# Patient Record
Sex: Male | Born: 1945 | Race: White | Hispanic: No | Smoking: Former smoker
Health system: Southern US, Community
[De-identification: ages and names within clinical notes are randomized; demographics above are authoritative.]

## PROBLEM LIST (undated history)

## (undated) DIAGNOSIS — J449 Chronic obstructive pulmonary disease, unspecified: Secondary | ICD-10-CM

## (undated) DIAGNOSIS — H9190 Unspecified hearing loss, unspecified ear: Secondary | ICD-10-CM

## (undated) DIAGNOSIS — K219 Gastro-esophageal reflux disease without esophagitis: Secondary | ICD-10-CM

## (undated) DIAGNOSIS — R0600 Dyspnea, unspecified: Secondary | ICD-10-CM

## (undated) DIAGNOSIS — I2510 Atherosclerotic heart disease of native coronary artery without angina pectoris: Secondary | ICD-10-CM

## (undated) DIAGNOSIS — E785 Hyperlipidemia, unspecified: Secondary | ICD-10-CM

## (undated) DIAGNOSIS — M1990 Unspecified osteoarthritis, unspecified site: Secondary | ICD-10-CM

## (undated) DIAGNOSIS — I10 Essential (primary) hypertension: Secondary | ICD-10-CM

## (undated) DIAGNOSIS — I499 Cardiac arrhythmia, unspecified: Secondary | ICD-10-CM

## (undated) DIAGNOSIS — Z972 Presence of dental prosthetic device (complete) (partial): Secondary | ICD-10-CM

## (undated) DIAGNOSIS — M109 Gout, unspecified: Secondary | ICD-10-CM

## (undated) DIAGNOSIS — E7800 Pure hypercholesterolemia, unspecified: Secondary | ICD-10-CM

## (undated) HISTORY — PX: COLONOSCOPY: SHX174

## (undated) HISTORY — PX: JOINT REPLACEMENT: SHX530

## (undated) HISTORY — PX: OTHER SURGICAL HISTORY: SHX169

## (undated) HISTORY — PX: TOTAL HIP ARTHROPLASTY: SHX124

---

## 2004-06-19 ENCOUNTER — Ambulatory Visit: Payer: Self-pay | Admitting: Gastroenterology

## 2010-03-22 ENCOUNTER — Emergency Department: Payer: Self-pay | Admitting: Emergency Medicine

## 2010-07-31 ENCOUNTER — Ambulatory Visit: Payer: Self-pay | Admitting: Gastroenterology

## 2010-10-16 ENCOUNTER — Ambulatory Visit: Payer: Self-pay | Admitting: Family Medicine

## 2014-04-01 DIAGNOSIS — A4902 Methicillin resistant Staphylococcus aureus infection, unspecified site: Secondary | ICD-10-CM

## 2014-04-01 HISTORY — DX: Methicillin resistant Staphylococcus aureus infection, unspecified site: A49.02

## 2017-05-28 DIAGNOSIS — G959 Disease of spinal cord, unspecified: Secondary | ICD-10-CM | POA: Insufficient documentation

## 2019-12-06 ENCOUNTER — Emergency Department: Payer: Medicare Other

## 2019-12-06 ENCOUNTER — Other Ambulatory Visit: Payer: Self-pay

## 2019-12-06 ENCOUNTER — Encounter: Payer: Self-pay | Admitting: Emergency Medicine

## 2019-12-06 DIAGNOSIS — Z87891 Personal history of nicotine dependence: Secondary | ICD-10-CM | POA: Insufficient documentation

## 2019-12-06 DIAGNOSIS — R0789 Other chest pain: Secondary | ICD-10-CM | POA: Insufficient documentation

## 2019-12-06 DIAGNOSIS — I10 Essential (primary) hypertension: Secondary | ICD-10-CM | POA: Insufficient documentation

## 2019-12-06 LAB — CBC
HCT: 41.3 % (ref 39.0–52.0)
Hemoglobin: 14.6 g/dL (ref 13.0–17.0)
MCH: 31 pg (ref 26.0–34.0)
MCHC: 35.4 g/dL (ref 30.0–36.0)
MCV: 87.7 fL (ref 80.0–100.0)
Platelets: 205 10*3/uL (ref 150–400)
RBC: 4.71 MIL/uL (ref 4.22–5.81)
RDW: 13.3 % (ref 11.5–15.5)
WBC: 13.8 10*3/uL — ABNORMAL HIGH (ref 4.0–10.5)
nRBC: 0 % (ref 0.0–0.2)

## 2019-12-06 LAB — BASIC METABOLIC PANEL
Anion gap: 14 (ref 5–15)
BUN: 22 mg/dL (ref 8–23)
CO2: 23 mmol/L (ref 22–32)
Calcium: 8.5 mg/dL — ABNORMAL LOW (ref 8.9–10.3)
Chloride: 99 mmol/L (ref 98–111)
Creatinine, Ser: 1.17 mg/dL (ref 0.61–1.24)
GFR calc Af Amer: >60 mL/min (ref >60)
GFR calc non Af Amer: >60 mL/min (ref >60)
Glucose, Bld: 189 mg/dL — ABNORMAL HIGH (ref 70–99)
Potassium: 3.4 mmol/L — ABNORMAL LOW (ref 3.5–5.1)
Sodium: 136 mmol/L (ref 135–145)

## 2019-12-06 LAB — TROPONIN I (HIGH SENSITIVITY)
Troponin I (High Sensitivity): 11 ng/L (ref <18)
Troponin I (High Sensitivity): 8 ng/L (ref <18)

## 2019-12-06 NOTE — ED Triage Notes (Signed)
Pt started acutely with chest pressure/heaviness to left chest 20-30 minutes ago.  Was having difficulty getting deep breath in. Yesterday reports having problems with reflux that woke him up at 3 AM today.  Pressure in chest today was different than the burning.  "it felt like someone was stepping on my chest".

## 2019-12-07 ENCOUNTER — Emergency Department
Admission: EM | Admit: 2019-12-07 | Discharge: 2019-12-07 | Disposition: A | Discharge status: Home / Self Care | Payer: Medicare Other | Attending: Emergency Medicine | Admitting: Emergency Medicine

## 2019-12-07 DIAGNOSIS — R079 Chest pain, unspecified: Secondary | ICD-10-CM

## 2019-12-07 HISTORY — DX: Hyperlipidemia, unspecified: E78.5

## 2019-12-07 HISTORY — DX: Essential (primary) hypertension: I10

## 2019-12-07 LAB — TROPONIN I (HIGH SENSITIVITY): Troponin I (High Sensitivity): 8 ng/L (ref <18)

## 2019-12-07 LAB — HEPATIC FUNCTION PANEL
ALT: 21 U/L (ref 0–44)
AST: 22 U/L (ref 15–41)
Albumin: 3.8 g/dL (ref 3.5–5.0)
Alkaline Phosphatase: 58 U/L (ref 38–126)
Bilirubin, Direct: 0.2 mg/dL (ref 0.0–0.2)
Indirect Bilirubin: 1 mg/dL — ABNORMAL HIGH (ref 0.3–0.9)
Total Bilirubin: 1.2 mg/dL (ref 0.3–1.2)
Total Protein: 7.3 g/dL (ref 6.5–8.1)

## 2019-12-07 LAB — LIPASE, BLOOD: Lipase: 27 U/L (ref 11–51)

## 2019-12-07 MED ORDER — ASPIRIN 81 MG PO CHEW
324.0000 mg | CHEWABLE_TABLET | Freq: Once | ORAL | Status: AC
  Administered 2019-12-07: 324 mg via ORAL
  Filled 2019-12-07: qty 4

## 2019-12-07 NOTE — Discharge Instructions (Addendum)
As explained to you, I am concerned that your chest pain could be due to a heart blockage.  In order to avoid a heart attack please make sure to call Dr. Milta Deiters office first thing in the morning for close follow-up.  In the meantime take a daily baby aspirin.  Return to the emergency room for any further episodes of chest pain or shortness of breath.  Also return to the emergency room if you change your mind and wish to be admitted for further evaluation.

## 2019-12-07 NOTE — ED Notes (Signed)
Patient verbalizes understanding of discharge instructions. Opportunity for questioning and answers were provided. Armband removed by staff, pt discharged from ED. Ambulated out to lobby  

## 2019-12-07 NOTE — ED Notes (Signed)
Pt states he experienced one episode of chest pain at 1810 and it since stopped with no other difficulty

## 2019-12-07 NOTE — ED Provider Notes (Signed)
[facility] Emergency Department Provider Note  ____________________________________________  Time seen: Approximately 3:42 AM  I have reviewed the triage vital signs and the nursing notes.   HISTORY  Chief Complaint Chest Pain   HPI Scott Parker is a 74 y.o. male history of GERD, hypertension, hyperlipidemia who presents for evaluation of chest pain.  Patient reports that he had just eaten some popcorn, 2 chocolates and had a beer when he developed sudden onset of chest pain.  He describes the pain is located in the center of his chest, like someone was stepping on his chest, he broke out in a sweat and feel short of breath.  The whole episode lasted about an hour.  No nausea, vomiting, dizziness.  Patient has been chest pain-free for greater than 8 hours.  He denies ever having similar pain in his chest.  He denies any personal history of coronary artery disease.  He is a former smoker.  Has a strong family history of CAD.  No personal or family history of blood clots, no recent travel immobilization, no leg pain or swelling, no hemoptysis or exogenous hormones.   Past Medical History:  Diagnosis Date  . Hyperlipemia   . Hypertension     There are no problems to display for this patient.   History reviewed. No pertinent surgical history.  Prior to Admission medications   Not on File    Allergies Patient has no known allergies.  History reviewed. No pertinent family history.  Social History Social History   Tobacco Use  . Smoking status: Former Games developer  . Smokeless tobacco: Never Used  Substance Use Topics  . Alcohol use: Yes  . Drug use: Never    Review of Systems  Constitutional: Negative for fever. Eyes: Negative for visual changes. ENT: Negative for sore throat. Neck: No neck pain  Cardiovascular: + chest pain. Respiratory: Negative for shortness of breath. Gastrointestinal: Negative for abdominal pain, vomiting or  diarrhea. Genitourinary: Negative for dysuria. Musculoskeletal: Negative for back pain. Skin: Negative for rash. Neurological: Negative for headaches, weakness or numbness. Psych: No SI or HI  ____________________________________________   PHYSICAL EXAM:  VITAL SIGNS: ED Triage Vitals  Enc Vitals Group     BP 12/06/19 1843 (!) 177/93     Pulse Rate 12/06/19 1843 98     Resp 12/06/19 1843 18     Temp 12/06/19 1843 97.6 F (36.4 C)     Temp Source 12/06/19 1843 Oral     SpO2 12/06/19 1843 97 %     Weight 12/06/19 1840 182 lb (82.6 kg)     Height 12/06/19 1840 5\' 10"  (1.778 m)     Head Circumference --      Peak Flow --      Pain Score 12/06/19 1840 9     Pain Loc --      Pain Edu? --      Excl. in GC? --     Constitutional: Alert and oriented. Well appearing and in no apparent distress. HEENT:      Head: Normocephalic and atraumatic.         Eyes: Conjunctivae are normal. Sclera is non-icteric.       Mouth/Throat: Mucous membranes are moist.       Neck: Supple with no signs of meningismus. Cardiovascular: Regular rate and rhythm. No murmurs, gallops, or rubs. 2+ symmetrical distal pulses are present in all extremities. No JVD. Respiratory: Normal respiratory effort. Lungs are clear to auscultation bilaterally.  Gastrointestinal: Soft, non tender, and non distended. Musculoskeletal:  No edema, cyanosis, or erythema of extremities. Neurologic: Normal speech and language. Face is symmetric. Moving all extremities. No gross focal neurologic deficits are appreciated. Skin: Skin is warm, dry and intact. No rash noted. Psychiatric: Mood and affect are normal. Speech and behavior are normal.  ____________________________________________   LABS (all labs ordered are listed, but only abnormal results are displayed)  Labs Reviewed  BASIC METABOLIC PANEL - Abnormal; Notable for the following components:      Result Value   Potassium 3.4 (*)    Glucose, Bld 189 (*)    Calcium  8.5 (*)    All other components within normal limits  CBC - Abnormal; Notable for the following components:   WBC 13.8 (*)    All other components within normal limits  HEPATIC FUNCTION PANEL - Abnormal; Notable for the following components:   Indirect Bilirubin 1.0 (*)    All other components within normal limits  LIPASE, BLOOD  TROPONIN I (HIGH SENSITIVITY)  TROPONIN I (HIGH SENSITIVITY)  TROPONIN I (HIGH SENSITIVITY)   ____________________________________________  EKG  ED ECG REPORT I, Nita Sickle, the attending physician, personally viewed and interpreted this ECG.  Normal sinus rhythm, rate of 92, normal intervals, normal axis, no ST elevations or depressions. ____________________________________________  RADIOLOGY  I have personally reviewed the images performed during this visit and I agree with the Radiologist's read.   Interpretation by Radiologist:  DG Chest 2 View  Result Date: 12/06/2019 CLINICAL DATA:  Chest pain and heaviness beginning 30 minutes ago. Difficulty taking a deep breath. EXAM: CHEST - 2 VIEW COMPARISON:  03/23/2010 FINDINGS: Cardiac silhouette is normal in size. No mediastinal or hilar masses or evidence of adenopathy. Clear lungs.  No pleural effusion or pneumothorax. Mild to moderate compression deformity of a mid to lower thoracic vertebra, of unclear chronicity but likely old. Skeletal structures are demineralized but otherwise unremarkable. IMPRESSION: 1. No acute cardiopulmonary disease. 2. Compression fracture of a mid to lower thoracic vertebra of unclear chronicity. Electronically Signed   By: Amie Portland M.D.   On: 12/06/2019 19:13     ____________________________________________   PROCEDURES  Procedure(s) performed:yes .1-3 Lead EKG Interpretation Performed by: Nita Sickle, MD Authorized by: Nita Sickle, MD     Interpretation: normal     ECG rate assessment: normal     Rhythm: sinus rhythm     Ectopy: none      Critical Care performed:  None ____________________________________________   INITIAL IMPRESSION / ASSESSMENT AND PLAN / ED COURSE   74 y.o. male history of GERD, hypertension, hyperlipidemia who presents for evaluation of chest pain.  Patient is currently well-appearing in no distress with normal vital signs, neurologically intact, strong pulses in all 4 extremities, heart regular rate and rhythm with no murmurs, lungs are clear to auscultation, patient looks euvolemic with no asymmetric leg swelling.  EKG shows no acute ischemic changes.  Differential diagnosis including ACS versus GERD versus esophageal spasm.  Less likely PE or dissection with resolved symptoms, no tachypnea, no tachycardia, no hypoxia, no severely elevated blood pressure, no neuro deficits, normal mediastinum silhouette on x-ray.  Heart score 4.  Patient has been chest pain-free in the waiting room for 8 hours. Discussed admission with patient however he prefers to follow-up as an outpatient.  He does understand the concerns that his episode could be due to CAD/ACS but patient is here doing a job for the rest of the week and does  not wish to miss his job.  I have contacted Dr. Welton Flakes for cardiology in order to expedite close follow-up within the next 48 hours for further cardiac evaluation.  I recommended the patient continues to take his daily dose of baby aspirin and return to the emergency room for any further episodes of chest pain.  Since patient wanted to be discharged I did get a third high-sensitivity troponin which remains negative.  Patient has been in the ED for greater than 10 hours with no further episodes of chest pain.      _____________________________________________ Please note:  Patient was evaluated in Emergency Department today for the symptoms described in the history of present illness. Patient was evaluated in the context of the global COVID-19 pandemic, which necessitated consideration that the patient  might be at risk for infection with the SARS-CoV-2 virus that causes COVID-19. Institutional protocols and algorithms that pertain to the evaluation of patients at risk for COVID-19 are in a state of rapid change based on information released by regulatory bodies including the CDC and federal and state organizations. These policies and algorithms were followed during the patient's care in the ED.  Some ED evaluations and interventions may be delayed as a result of limited staffing during the pandemic.   [place] Controlled Substance Database was reviewed by me. ____________________________________________   FINAL CLINICAL IMPRESSION(S) / ED DIAGNOSES   Final diagnoses:  Chest pain, unspecified type      NEW MEDICATIONS STARTED DURING THIS VISIT:  ED Discharge Orders    None       Note:  This document was prepared using Dragon voice recognition software and may include unintentional dictation errors.    Don Perking, [place], MD 12/07/19 0500

## 2019-12-31 HISTORY — PX: CARDIAC CATHETERIZATION: SHX172

## 2020-01-04 ENCOUNTER — Ambulatory Visit: Payer: Self-pay | Admitting: Cardiovascular Disease

## 2020-01-04 ENCOUNTER — Other Ambulatory Visit: Payer: Self-pay | Admitting: Cardiovascular Disease

## 2020-01-04 ENCOUNTER — Other Ambulatory Visit: Payer: Self-pay

## 2020-01-04 ENCOUNTER — Other Ambulatory Visit
Admission: RE | Admit: 2020-01-04 | Discharge: 2020-01-04 | Disposition: A | Discharge status: Home / Self Care | Payer: Medicare Other | Source: Ambulatory Visit | Attending: Cardiovascular Disease | Admitting: Cardiovascular Disease

## 2020-01-04 DIAGNOSIS — Z20822 Contact with and (suspected) exposure to covid-19: Secondary | ICD-10-CM | POA: Diagnosis not present

## 2020-01-04 DIAGNOSIS — Z01812 Encounter for preprocedural laboratory examination: Secondary | ICD-10-CM | POA: Diagnosis present

## 2020-01-04 LAB — SARS CORONAVIRUS 2 (TAT 6-24 HRS): SARS Coronavirus 2: NEGATIVE

## 2020-01-04 MED ORDER — SODIUM CHLORIDE 0.9% FLUSH
3.0000 mL | Freq: Two times a day (BID) | INTRAVENOUS | Status: DC
  Filled 2020-01-04: qty 3

## 2020-01-06 ENCOUNTER — Other Ambulatory Visit: Payer: Self-pay

## 2020-01-06 ENCOUNTER — Encounter: Payer: Self-pay | Admitting: Cardiovascular Disease

## 2020-01-06 ENCOUNTER — Ambulatory Visit
Admission: RE | Admit: 2020-01-06 | Discharge: 2020-01-06 | Disposition: A | Discharge status: Home / Self Care | Payer: Medicare Other | Source: Ambulatory Visit | Attending: Cardiovascular Disease | Admitting: Cardiovascular Disease

## 2020-01-06 ENCOUNTER — Encounter
Admission: RE | Disposition: A | Discharge status: Home / Self Care | Payer: Self-pay | Source: Ambulatory Visit | Attending: Cardiovascular Disease

## 2020-01-06 DIAGNOSIS — Z79899 Other long term (current) drug therapy: Secondary | ICD-10-CM | POA: Insufficient documentation

## 2020-01-06 DIAGNOSIS — I10 Essential (primary) hypertension: Secondary | ICD-10-CM | POA: Diagnosis not present

## 2020-01-06 DIAGNOSIS — E785 Hyperlipidemia, unspecified: Secondary | ICD-10-CM | POA: Insufficient documentation

## 2020-01-06 DIAGNOSIS — I25119 Atherosclerotic heart disease of native coronary artery with unspecified angina pectoris: Secondary | ICD-10-CM | POA: Diagnosis not present

## 2020-01-06 HISTORY — DX: Pure hypercholesterolemia, unspecified: E78.00

## 2020-01-06 HISTORY — DX: Chronic obstructive pulmonary disease, unspecified: J44.9

## 2020-01-06 HISTORY — PX: LEFT HEART CATH: CATH118248

## 2020-01-06 SURGERY — LEFT HEART CATH
Anesthesia: Moderate Sedation

## 2020-01-06 MED ORDER — SODIUM CHLORIDE 0.9 % WEIGHT BASED INFUSION
253.0000 mL/h | INTRAVENOUS | Status: DC
  Administered 2020-01-06: 3 mL/kg/h via INTRAVENOUS

## 2020-01-06 MED ORDER — HEPARIN (PORCINE) IN NACL 1000-0.9 UT/500ML-% IV SOLN
INTRAVENOUS | Status: AC
  Filled 2020-01-06: qty 1000

## 2020-01-06 MED ORDER — HEPARIN (PORCINE) IN NACL 1000-0.9 UT/500ML-% IV SOLN
INTRAVENOUS | Status: DC | PRN
  Administered 2020-01-06: 500 mL

## 2020-01-06 MED ORDER — SODIUM CHLORIDE 0.9 % IV SOLN: 250.0000 mL | INTRAVENOUS | Status: DC | PRN

## 2020-01-06 MED ORDER — ASPIRIN 81 MG PO CHEW
CHEWABLE_TABLET | ORAL | Status: AC
  Filled 2020-01-06: qty 1

## 2020-01-06 MED ORDER — ONDANSETRON HCL 4 MG/2ML IJ SOLN: 4.0000 mg | Freq: Four times a day (QID) | INTRAMUSCULAR | Status: DC | PRN

## 2020-01-06 MED ORDER — ASPIRIN 81 MG PO CHEW
81.0000 mg | CHEWABLE_TABLET | ORAL | Status: AC
  Administered 2020-01-06: 81 mg via ORAL

## 2020-01-06 MED ORDER — SODIUM CHLORIDE 0.9% FLUSH: 3.0000 mL | INTRAVENOUS | Status: DC | PRN

## 2020-01-06 MED ORDER — SODIUM CHLORIDE 0.9% FLUSH: 3.0000 mL | Freq: Two times a day (BID) | INTRAVENOUS | Status: DC

## 2020-01-06 MED ORDER — IOHEXOL 300 MG/ML  SOLN
INTRAMUSCULAR | Status: DC | PRN
  Administered 2020-01-06: 50 mL

## 2020-01-06 MED ORDER — FENTANYL CITRATE (PF) 100 MCG/2ML IJ SOLN
INTRAMUSCULAR | Status: AC
  Filled 2020-01-06: qty 2

## 2020-01-06 MED ORDER — LABETALOL HCL 5 MG/ML IV SOLN: 10.0000 mg | INTRAVENOUS | Status: DC | PRN

## 2020-01-06 MED ORDER — SODIUM CHLORIDE 0.9 % WEIGHT BASED INFUSION: 84.4000 mL/h | INTRAVENOUS | Status: DC

## 2020-01-06 MED ORDER — MIDAZOLAM HCL 2 MG/2ML IJ SOLN
INTRAMUSCULAR | Status: AC
  Filled 2020-01-06: qty 2

## 2020-01-06 MED ORDER — SODIUM CHLORIDE 0.9 % WEIGHT BASED INFUSION: 1.0000 mL/kg/h | INTRAVENOUS | Status: DC

## 2020-01-06 MED ORDER — MIDAZOLAM HCL 2 MG/2ML IJ SOLN
INTRAMUSCULAR | Status: DC | PRN
  Administered 2020-01-06: 1 mg via INTRAVENOUS

## 2020-01-06 MED ORDER — ACETAMINOPHEN 325 MG PO TABS: 650.0000 mg | ORAL_TABLET | ORAL | Status: DC | PRN

## 2020-01-06 MED ORDER — HYDRALAZINE HCL 20 MG/ML IJ SOLN: 10.0000 mg | INTRAMUSCULAR | Status: DC | PRN

## 2020-01-06 MED ORDER — FENTANYL CITRATE (PF) 100 MCG/2ML IJ SOLN
INTRAMUSCULAR | Status: DC | PRN
Start: 2020-01-06 — End: 2020-01-06
  Administered 2020-01-06: 50 ug via INTRAVENOUS

## 2020-01-06 SURGICAL SUPPLY — 9 items
CATH INFINITI 5FR ANG PIGTAIL (CATHETERS) ×3 IMPLANT
CATH INFINITI 5FR JL4 (CATHETERS) ×3 IMPLANT
CATH INFINITI JR4 5F (CATHETERS) ×3 IMPLANT
DEVICE CLOSURE MYNXGRIP 5F (Vascular Products) ×3 IMPLANT
KIT MANI 3VAL PERCEP (MISCELLANEOUS) ×3 IMPLANT
NEEDLE PERC 18GX7CM (NEEDLE) ×3 IMPLANT
PACK CARDIAC CATH (CUSTOM PROCEDURE TRAY) ×3 IMPLANT
SHEATH AVANTI 5FR X 11CM (SHEATH) ×3 IMPLANT
WIRE GUIDERIGHT .035X150 (WIRE) ×3 IMPLANT

## 2020-01-06 NOTE — Progress Notes (Signed)
Pt arrived from home for scheduled procedure with complaints of 2/10 right midaxillary pain asking if he should take his Nitro from home. 12-lead ECG completed, Dr Welton Flakes immediately notified. Patient's pain resolved before ECG was completed. No new orders at this time from MD. Will continue to monitor, patient awaiting procedure.

## 2020-02-16 ENCOUNTER — Encounter: Payer: Self-pay | Admitting: Ophthalmology

## 2020-02-16 ENCOUNTER — Other Ambulatory Visit: Payer: Self-pay

## 2020-02-18 ENCOUNTER — Other Ambulatory Visit: Payer: Self-pay

## 2020-02-18 ENCOUNTER — Other Ambulatory Visit
Admission: RE | Admit: 2020-02-18 | Discharge: 2020-02-18 | Disposition: A | Discharge status: Home / Self Care | Payer: Medicare Other | Source: Ambulatory Visit | Attending: Ophthalmology | Admitting: Ophthalmology

## 2020-02-18 DIAGNOSIS — Z20822 Contact with and (suspected) exposure to covid-19: Secondary | ICD-10-CM | POA: Insufficient documentation

## 2020-02-18 DIAGNOSIS — Z01812 Encounter for preprocedural laboratory examination: Secondary | ICD-10-CM | POA: Insufficient documentation

## 2020-02-18 NOTE — Discharge Instructions (Signed)

## 2020-02-19 LAB — SARS CORONAVIRUS 2 (TAT 6-24 HRS): SARS Coronavirus 2: NEGATIVE

## 2020-02-22 ENCOUNTER — Encounter: Payer: Self-pay | Admitting: Ophthalmology

## 2020-02-22 ENCOUNTER — Other Ambulatory Visit: Payer: Self-pay

## 2020-02-22 ENCOUNTER — Ambulatory Visit: Payer: Medicare Other | Admitting: Anesthesiology

## 2020-02-22 ENCOUNTER — Ambulatory Visit
Admission: RE | Admit: 2020-02-22 | Discharge: 2020-02-22 | Disposition: A | Discharge status: Home / Self Care | Payer: Medicare Other | Source: Ambulatory Visit | Attending: Ophthalmology | Admitting: Ophthalmology

## 2020-02-22 ENCOUNTER — Encounter
Admission: RE | Disposition: A | Discharge status: Home / Self Care | Payer: Self-pay | Source: Ambulatory Visit | Attending: Ophthalmology

## 2020-02-22 DIAGNOSIS — Z885 Allergy status to narcotic agent status: Secondary | ICD-10-CM | POA: Diagnosis not present

## 2020-02-22 DIAGNOSIS — Z87891 Personal history of nicotine dependence: Secondary | ICD-10-CM | POA: Insufficient documentation

## 2020-02-22 DIAGNOSIS — H2512 Age-related nuclear cataract, left eye: Secondary | ICD-10-CM | POA: Diagnosis not present

## 2020-02-22 DIAGNOSIS — J449 Chronic obstructive pulmonary disease, unspecified: Secondary | ICD-10-CM | POA: Insufficient documentation

## 2020-02-22 DIAGNOSIS — I10 Essential (primary) hypertension: Secondary | ICD-10-CM | POA: Insufficient documentation

## 2020-02-22 HISTORY — DX: Unspecified osteoarthritis, unspecified site: M19.90

## 2020-02-22 HISTORY — DX: Presence of dental prosthetic device (complete) (partial): Z97.2

## 2020-02-22 HISTORY — PX: CATARACT EXTRACTION W/PHACO: SHX586

## 2020-02-22 SURGERY — PHACOEMULSIFICATION, CATARACT, WITH IOL INSERTION
Anesthesia: Monitor Anesthesia Care | Site: Eye | Laterality: Left

## 2020-02-22 MED ORDER — MIDAZOLAM HCL 2 MG/2ML IJ SOLN
INTRAMUSCULAR | Status: DC | PRN
  Administered 2020-02-22: 1 mg via INTRAVENOUS

## 2020-02-22 MED ORDER — BRIMONIDINE TARTRATE-TIMOLOL 0.2-0.5 % OP SOLN
OPHTHALMIC | Status: DC | PRN
  Administered 2020-02-22: 1 [drp] via OPHTHALMIC

## 2020-02-22 MED ORDER — TETRACAINE HCL 0.5 % OP SOLN
1.0000 [drp] | OPHTHALMIC | Status: DC | PRN
  Administered 2020-02-22 (×3): 1 [drp] via OPHTHALMIC

## 2020-02-22 MED ORDER — LACTATED RINGERS IV SOLN: INTRAVENOUS | Status: DC

## 2020-02-22 MED ORDER — MOXIFLOXACIN HCL 0.5 % OP SOLN
OPHTHALMIC | Status: DC | PRN
  Administered 2020-02-22: 0.2 mL via OPHTHALMIC

## 2020-02-22 MED ORDER — NA CHONDROIT SULF-NA HYALURON 40-17 MG/ML IO SOLN
INTRAOCULAR | Status: DC | PRN
  Administered 2020-02-22: 1 mL via INTRAOCULAR

## 2020-02-22 MED ORDER — ONDANSETRON HCL 4 MG/2ML IJ SOLN: 4.0000 mg | Freq: Once | INTRAMUSCULAR | Status: DC | PRN

## 2020-02-22 MED ORDER — FENTANYL CITRATE (PF) 100 MCG/2ML IJ SOLN
INTRAMUSCULAR | Status: DC | PRN
  Administered 2020-02-22: 50 ug via INTRAVENOUS

## 2020-02-22 MED ORDER — LIDOCAINE HCL (PF) 2 % IJ SOLN
INTRAOCULAR | Status: DC | PRN
  Administered 2020-02-22: 2 mL

## 2020-02-22 MED ORDER — ARMC OPHTHALMIC DILATING DROPS
1.0000 "application " | OPHTHALMIC | Status: DC | PRN
  Administered 2020-02-22 (×3): 1 via OPHTHALMIC

## 2020-02-22 MED ORDER — ACETAMINOPHEN 10 MG/ML IV SOLN: 1000.0000 mg | Freq: Once | INTRAVENOUS | Status: DC | PRN

## 2020-02-22 MED ORDER — EPINEPHRINE PF 1 MG/ML IJ SOLN
INTRAOCULAR | Status: DC | PRN
  Administered 2020-02-22: 89 mL via OPHTHALMIC

## 2020-02-22 SURGICAL SUPPLY — 19 items
CANNULA ANT/CHMB 27G (MISCELLANEOUS) ×2 IMPLANT
CANNULA ANT/CHMB 27GA (MISCELLANEOUS) ×6 IMPLANT
GLOVE SURG LX 8.0 MICRO (GLOVE) ×2
GLOVE SURG LX STRL 8.0 MICRO (GLOVE) ×1 IMPLANT
GLOVE SURG TRIUMPH 8.0 PF LTX (GLOVE) ×3 IMPLANT
GOWN STRL REUS W/ TWL LRG LVL3 (GOWN DISPOSABLE) ×2 IMPLANT
GOWN STRL REUS W/TWL LRG LVL3 (GOWN DISPOSABLE) ×6
LENS IOL TECNIS EYHANCE 20.5 (Intraocular Lens) ×2 IMPLANT
MARKER SKIN DUAL TIP RULER LAB (MISCELLANEOUS) ×3 IMPLANT
NDL FILTER BLUNT 18X1 1/2 (NEEDLE) ×1 IMPLANT
NEEDLE FILTER BLUNT 18X 1/2SAF (NEEDLE) ×2
NEEDLE FILTER BLUNT 18X1 1/2 (NEEDLE) ×1 IMPLANT
PACK EYE AFTER SURG (MISCELLANEOUS) ×3 IMPLANT
PACK OPTHALMIC (MISCELLANEOUS) ×3 IMPLANT
PACK PORFILIO (MISCELLANEOUS) ×3 IMPLANT
SYR 3ML LL SCALE MARK (SYRINGE) ×3 IMPLANT
SYR TB 1ML LUER SLIP (SYRINGE) ×3 IMPLANT
WATER STERILE IRR 250ML POUR (IV SOLUTION) ×3 IMPLANT
WIPE NON LINTING 3.25X3.25 (MISCELLANEOUS) ×3 IMPLANT

## 2020-02-22 NOTE — Op Note (Signed)
PREOPERATIVE DIAGNOSIS:  Nuclear sclerotic cataract of the left eye.   POSTOPERATIVE DIAGNOSIS:  Nuclear sclerotic cataract of the left eye.   OPERATIVE PROCEDURE:@   SURGEON:  Galen Manila, MD.   ANESTHESIA:  Anesthesiologist: Heniser, Burman Foster, MD CRNA: Maree Krabbe, CRNA  1.      Managed anesthesia care. 2.     0.34ml of Shugarcaine was instilled following the paracentesis   COMPLICATIONS:  None.   TECHNIQUE:   Stop and chop   DESCRIPTION OF PROCEDURE:  The patient was examined and consented in the preoperative holding area where the aforementioned topical anesthesia was applied to the left eye and then brought back to the Operating Room where the left eye was prepped and draped in the usual sterile ophthalmic fashion and a lid speculum was placed. A paracentesis was created with the side port blade and the anterior chamber was filled with viscoelastic. A near clear corneal incision was performed with the steel keratome. A continuous curvilinear capsulorrhexis was performed with a cystotome followed by the capsulorrhexis forceps. Hydrodissection and hydrodelineation were carried out with BSS on a blunt cannula. The lens was removed in a stop and chop  technique and the remaining cortical material was removed with the irrigation-aspiration handpiece. The capsular bag was inflated with viscoelastic and the Technis [id number] lens was placed in the capsular bag without complication. The remaining viscoelastic was removed from the eye with the irrigation-aspiration handpiece. The wounds were hydrated. The anterior chamber was flushed with BSS and the eye was inflated to physiologic pressure. 0.44ml Vigamox was placed in the anterior chamber. The wounds were found to be water tight. The eye was dressed with Combigan. The patient was given protective glasses to wear throughout the day and a shield with which to sleep tonight. The patient was also given drops with which to begin a drop regimen today and  will follow-up with me in one day. Implant Name Type Inv. Item Serial No. Manufacturer Lot No. LRB No. Used Action  LENS IOL TECNIS EYHANCE 20.5 - [id number] Intraocular Lens LENS IOL TECNIS EYHANCE 20.5 [id number] Buscemi   Left 1 Implanted    Procedure(s): CATARACT EXTRACTION PHACO AND INTRAOCULAR LENS PLACEMENT (IOC) LEFT 4.45 00:41.2 (Left)  Electronically signed: Galen Manila 02/22/2020 10:35 AM

## 2020-02-22 NOTE — Transfer of Care (Signed)
Immediate Anesthesia Transfer of Care Note  Patient: Scott Parker  Procedure(s) Performed: CATARACT EXTRACTION PHACO AND INTRAOCULAR LENS PLACEMENT (IOC) LEFT 4.45 00:41.2 (Left Eye)  Patient Location: PACU  Anesthesia Type: MAC  Level of Consciousness: awake, alert  and patient cooperative  Airway and Oxygen Therapy: Patient Spontanous Breathing and Patient connected to supplemental oxygen  Post-op Assessment: Post-op Vital signs reviewed, Patient's Cardiovascular Status Stable, Respiratory Function Stable, Patent Airway and No signs of Nausea or vomiting  Post-op Vital Signs: Reviewed and stable  Complications: No complications documented.

## 2020-02-22 NOTE — H&P (Signed)
[facility]   Primary Care Physician:  System, Provider Not In Ophthalmologist: Dr. Druscilla Brownie  Pre-Procedure History & Physical: HPI:  Scott Parker is a 74 y.o. male here for cataract surgery.   Past Medical History:  Diagnosis Date  . Arthritis   . COPD (chronic obstructive pulmonary disease) (HCC)   . Hypercholesterolemia   . Hyperlipemia   . Hypertension   . Wears dentures    full upper, partial lower    Past Surgical History:  Procedure Laterality Date  . COLONOSCOPY     pt states he has had x 2  . fusion     cervical 3-7  . JOINT REPLACEMENT    . LEFT HEART CATH N/A 01/06/2020   Procedure: Left Heart Cath with possible intervention;  Surgeon: Laurier Nancy, MD;  Location: [facility] INVASIVE CV LAB;  Service: Cardiovascular;  Laterality: N/A;  . TOTAL HIP ARTHROPLASTY     bilateral    Prior to Admission medications   Medication Sig Start Date End Date Taking? Authorizing Provider  acetaminophen (TYLENOL) 500 MG tablet Take 1,000 mg by mouth every 6 (six) hours as needed for mild pain or moderate pain.   Yes [provider]  aspirin EC 81 MG tablet Take 81 mg by mouth daily. Swallow whole.   Yes [provider]  budesonide-formoterol (SYMBICORT) 160-4.5 MCG/ACT inhaler Inhale 2 puffs into the lungs daily.   Yes [provider]  Calcium Citrate (CITRACAL PO) Take 1 tablet by mouth daily.   Yes [provider]  Cinnamon 500 MG capsule Take 1,000 mg by mouth daily.   Yes [provider]  GARLIC PO Take 1 tablet by mouth daily.   Yes [provider]  hydrochlorothiazide (HYDRODIURIL) 25 MG tablet Take 25 mg by mouth daily. 12/07/19  Yes [provider]  ibuprofen (ADVIL) 200 MG tablet Take 200-400 mg by mouth See admin instructions. Take 400 mg in the morning and lunch and 200 mg in the evening   Yes [provider]  isosorbide mononitrate (IMDUR) 30 MG 24 hr tablet Take 30 mg by mouth daily.   Yes  [provider]  metoprolol tartrate (LOPRESSOR) 50 MG tablet Take 50 mg by mouth 2 (two) times daily. 12/30/19  Yes [provider]  omega-3 acid ethyl esters (LOVAZA) 1 g capsule Take by mouth daily.   Yes [provider]  omeprazole (PRILOSEC) 20 MG capsule Take 20 mg by mouth daily.   Yes [provider]  rosuvastatin (CRESTOR) 20 MG tablet Take 20 mg by mouth daily. 12/27/19  Yes [provider]  zinc gluconate 50 MG tablet Take 50 mg by mouth daily.   Yes [provider]  albuterol (VENTOLIN HFA) 108 (90 Base) MCG/ACT inhaler Inhale into the lungs every 6 (six) hours as needed for wheezing or shortness of breath.    [provider]    Allergies as of 02/07/2020 - Review Complete 01/06/2020  Allergen Reaction Noted  . Codeine Other (See Comments) 07/22/2019    History reviewed. No pertinent family history.  Social History   Socioeconomic History  . Marital status: Divorced    Spouse name: Not on file  . Number of children: Not on file  . Years of education: Not on file  . Highest education level: Not on file  Occupational History  . Not on file  Tobacco Use  . Smoking status: Former Smoker    Types: Cigarettes    Quit date: 10/1995  Years since quitting: 24.3  . Smokeless tobacco: Never Used  Vaping Use  . Vaping Use: Never used  Substance and Sexual Activity  . Alcohol use: Not Currently  . Drug use: Never  . Sexual activity: Not on file  Other Topics Concern  . Not on file  Social History Narrative  . Not on file   Social Determinants of Health   Financial Resource Strain:   . Difficulty of Paying Living Expenses: Not on file  Food Insecurity:   . Worried About Programme researcher, broadcasting/film/video in the Last Year: Not on file  . Ran Out of Food in the Last Year: Not on file  Transportation Needs:   . Lack of Transportation (Medical): Not on file  . Lack of Transportation (Non-Medical): Not on file  Physical  Activity:   . Days of Exercise per Week: Not on file  . Minutes of Exercise per Session: Not on file  Stress:   . Feeling of Stress : Not on file  Social Connections:   . Frequency of Communication with Friends and Family: Not on file  . Frequency of Social Gatherings with Friends and Family: Not on file  . Attends Religious Services: Not on file  . Active Member of Clubs or Organizations: Not on file  . Attends Banker Meetings: Not on file  . Marital Status: Not on file  Intimate Partner Violence:   . Fear of Current or Ex-Partner: Not on file  . Emotionally Abused: Not on file  . Physically Abused: Not on file  . Sexually Abused: Not on file    Review of Systems: See HPI, otherwise negative ROS  Physical Exam: BP (!) 128/92   Pulse 91   Temp 97.6 F (36.4 C) (Temporal)   Resp 16   Ht 5\' 10"  (1.778 m)   Wt 80.7 kg   SpO2 99%   BMI 25.54 kg/m  General:   Alert,  pleasant and cooperative in NAD Head:  Normocephalic and atraumatic. Respiratory:  Normal work of breathing. Heart:  Regular rate and rhythm.   Impression/Plan: Scott Parker is here for cataract surgery.  Risks, benefits, limitations, and alternatives regarding cataract surgery have been reviewed with the patient.  Questions have been answered.  All parties agreeable.   Wannetta Sender, MD  02/22/2020, 10:05 AM

## 2020-02-22 NOTE — Anesthesia Preprocedure Evaluation (Signed)
Anesthesia Evaluation  Patient identified by MRN, date of birth, ID band Patient awake    Reviewed: Allergy & Precautions, NPO status , Patient's Chart, lab work & pertinent test results, reviewed documented beta blocker date and time   History of Anesthesia Complications Negative for: history of anesthetic complications  Airway Mallampati: III  TM Distance: >3 FB Neck ROM: Limited    Dental  (+) Upper Dentures, Partial Lower   Pulmonary COPD, former smoker,    breath sounds clear to auscultation       Cardiovascular hypertension, (-) angina+ CAD  (-) DOE  Rhythm:Regular Rate:Normal   HLD  LHC (12/2019): 1st Mrg 75% stenosed   Neuro/Psych    GI/Hepatic GERD  Controlled,  Endo/Other    Renal/GU      Musculoskeletal  (+) Arthritis ,   Abdominal   Peds  Hematology   Anesthesia Other Findings   Reproductive/Obstetrics                             Anesthesia Physical Anesthesia Plan  ASA: II  Anesthesia Plan: MAC   Post-op Pain Management:    Induction: Intravenous  PONV Risk Score and Plan: 1 and TIVA, Midazolam and Treatment may vary due to age or medical condition  Airway Management Planned: Nasal Cannula  Additional Equipment:   Intra-op Plan:   Post-operative Plan:   Informed Consent: I have reviewed the patients History and Physical, chart, labs and discussed the procedure including the risks, benefits and alternatives for the proposed anesthesia with the patient or authorized representative who has indicated his/her understanding and acceptance.       Plan Discussed with: CRNA and Anesthesiologist  Anesthesia Plan Comments:         Anesthesia Quick Evaluation

## 2020-02-22 NOTE — Anesthesia Procedure Notes (Signed)
Procedure Name: MAC Date/Time: 02/22/2020 10:12 AM Performed by: Cameron Ali, CRNA Pre-anesthesia Checklist: Patient identified, Emergency Drugs available, Suction available, Timeout performed and Patient being monitored Patient Re-evaluated:Patient Re-evaluated prior to induction Oxygen Delivery Method: Nasal cannula Placement Confirmation: positive ETCO2

## 2020-02-22 NOTE — Anesthesia Postprocedure Evaluation (Signed)
Anesthesia Post Note  Patient: Scott Parker  Procedure(s) Performed: CATARACT EXTRACTION PHACO AND INTRAOCULAR LENS PLACEMENT (IOC) LEFT 4.45 00:41.2 (Left Eye)     Patient location during evaluation: PACU Anesthesia Type: MAC Level of consciousness: awake and alert Pain management: pain level controlled Vital Signs Assessment: post-procedure vital signs reviewed and stable Respiratory status: spontaneous breathing, nonlabored ventilation, respiratory function stable and patient connected to nasal cannula oxygen Cardiovascular status: stable and blood pressure returned to baseline Postop Assessment: no apparent nausea or vomiting Anesthetic complications: no   No complications documented.  Cayce Paschal A  Draydon Clairmont

## 2020-02-28 ENCOUNTER — Other Ambulatory Visit: Payer: Self-pay | Admitting: Ophthalmology

## 2020-03-08 ENCOUNTER — Other Ambulatory Visit: Payer: Self-pay

## 2020-03-08 ENCOUNTER — Encounter: Payer: Self-pay | Admitting: Ophthalmology

## 2020-03-10 ENCOUNTER — Other Ambulatory Visit
Admission: RE | Admit: 2020-03-10 | Discharge: 2020-03-10 | Disposition: A | Discharge status: Home / Self Care | Payer: Medicare Other | Source: Ambulatory Visit | Attending: Ophthalmology | Admitting: Ophthalmology

## 2020-03-10 ENCOUNTER — Other Ambulatory Visit: Payer: Self-pay

## 2020-03-10 DIAGNOSIS — Z01812 Encounter for preprocedural laboratory examination: Secondary | ICD-10-CM | POA: Insufficient documentation

## 2020-03-10 DIAGNOSIS — Z20822 Contact with and (suspected) exposure to covid-19: Secondary | ICD-10-CM | POA: Diagnosis not present

## 2020-03-10 NOTE — Discharge Instructions (Signed)

## 2020-03-11 LAB — SARS CORONAVIRUS 2 (TAT 6-24 HRS): SARS Coronavirus 2: NEGATIVE

## 2020-03-14 ENCOUNTER — Ambulatory Visit
Admission: RE | Admit: 2020-03-14 | Discharge: 2020-03-14 | Disposition: A | Discharge status: Home / Self Care | Payer: Medicare Other | Attending: Ophthalmology | Admitting: Ophthalmology

## 2020-03-14 ENCOUNTER — Encounter: Payer: Self-pay | Admitting: Ophthalmology

## 2020-03-14 ENCOUNTER — Other Ambulatory Visit: Payer: Self-pay

## 2020-03-14 ENCOUNTER — Ambulatory Visit: Payer: Medicare Other | Admitting: Anesthesiology

## 2020-03-14 ENCOUNTER — Encounter
Admission: RE | Disposition: A | Discharge status: Home / Self Care | Payer: Self-pay | Source: Home / Self Care | Attending: Ophthalmology

## 2020-03-14 DIAGNOSIS — Z7951 Long term (current) use of inhaled steroids: Secondary | ICD-10-CM | POA: Insufficient documentation

## 2020-03-14 DIAGNOSIS — Z96643 Presence of artificial hip joint, bilateral: Secondary | ICD-10-CM | POA: Diagnosis not present

## 2020-03-14 DIAGNOSIS — Z7982 Long term (current) use of aspirin: Secondary | ICD-10-CM | POA: Insufficient documentation

## 2020-03-14 DIAGNOSIS — Z79899 Other long term (current) drug therapy: Secondary | ICD-10-CM | POA: Diagnosis not present

## 2020-03-14 DIAGNOSIS — H2511 Age-related nuclear cataract, right eye: Secondary | ICD-10-CM | POA: Insufficient documentation

## 2020-03-14 DIAGNOSIS — Z87891 Personal history of nicotine dependence: Secondary | ICD-10-CM | POA: Insufficient documentation

## 2020-03-14 DIAGNOSIS — Z791 Long term (current) use of non-steroidal anti-inflammatories (NSAID): Secondary | ICD-10-CM | POA: Diagnosis not present

## 2020-03-14 HISTORY — DX: Cardiac arrhythmia, unspecified: I49.9

## 2020-03-14 HISTORY — DX: Gout, unspecified: M10.9

## 2020-03-14 HISTORY — DX: Unspecified hearing loss, unspecified ear: H91.90

## 2020-03-14 HISTORY — DX: Dyspnea, unspecified: R06.00

## 2020-03-14 HISTORY — DX: Atherosclerotic heart disease of native coronary artery without angina pectoris: I25.10

## 2020-03-14 HISTORY — DX: Gastro-esophageal reflux disease without esophagitis: K21.9

## 2020-03-14 HISTORY — PX: CATARACT EXTRACTION W/PHACO: SHX586

## 2020-03-14 SURGERY — PHACOEMULSIFICATION, CATARACT, WITH IOL INSERTION
Anesthesia: Monitor Anesthesia Care | Site: Eye | Laterality: Right

## 2020-03-14 MED ORDER — LACTATED RINGERS IV SOLN: INTRAVENOUS | Status: DC

## 2020-03-14 MED ORDER — BRIMONIDINE TARTRATE-TIMOLOL 0.2-0.5 % OP SOLN
OPHTHALMIC | Status: DC | PRN
  Administered 2020-03-14: 1 [drp] via OPHTHALMIC

## 2020-03-14 MED ORDER — EPINEPHRINE PF 1 MG/ML IJ SOLN
INTRAOCULAR | Status: DC | PRN
  Administered 2020-03-14: 11:00:00 76 mL via OPHTHALMIC

## 2020-03-14 MED ORDER — ARMC OPHTHALMIC DILATING DROPS
1.0000 "application " | OPHTHALMIC | Status: DC | PRN
  Administered 2020-03-14 (×3): 1 via OPHTHALMIC

## 2020-03-14 MED ORDER — TETRACAINE HCL 0.5 % OP SOLN
1.0000 [drp] | OPHTHALMIC | Status: DC | PRN
  Administered 2020-03-14 (×3): 1 [drp] via OPHTHALMIC

## 2020-03-14 MED ORDER — MOXIFLOXACIN HCL 0.5 % OP SOLN
OPHTHALMIC | Status: DC | PRN
  Administered 2020-03-14: 0.2 mL via OPHTHALMIC

## 2020-03-14 MED ORDER — LIDOCAINE HCL (PF) 2 % IJ SOLN
INTRAOCULAR | Status: DC | PRN
  Administered 2020-03-14: 2 mL

## 2020-03-14 MED ORDER — FENTANYL CITRATE (PF) 100 MCG/2ML IJ SOLN
INTRAMUSCULAR | Status: DC | PRN
  Administered 2020-03-14: 50 ug via INTRAVENOUS

## 2020-03-14 MED ORDER — NA CHONDROIT SULF-NA HYALURON 40-17 MG/ML IO SOLN
INTRAOCULAR | Status: DC | PRN
  Administered 2020-03-14: 1 mL via INTRAOCULAR

## 2020-03-14 MED ORDER — MIDAZOLAM HCL 2 MG/2ML IJ SOLN
INTRAMUSCULAR | Status: DC | PRN
  Administered 2020-03-14: .5 mg via INTRAVENOUS
  Administered 2020-03-14: 1 mg via INTRAVENOUS

## 2020-03-14 SURGICAL SUPPLY — 19 items
CANNULA ANT/CHMB 27G (MISCELLANEOUS) ×2 IMPLANT
CANNULA ANT/CHMB 27GA (MISCELLANEOUS) ×6 IMPLANT
GLOVE SURG LX 8.0 MICRO (GLOVE) ×2
GLOVE SURG LX STRL 8.0 MICRO (GLOVE) ×1 IMPLANT
GLOVE SURG TRIUMPH 8.0 PF LTX (GLOVE) ×3 IMPLANT
GOWN STRL REUS W/ TWL LRG LVL3 (GOWN DISPOSABLE) ×2 IMPLANT
GOWN STRL REUS W/TWL LRG LVL3 (GOWN DISPOSABLE) ×6
LENS IOL TECNIS EYHANCE 21.5 (Intraocular Lens) ×2 IMPLANT
MARKER SKIN DUAL TIP RULER LAB (MISCELLANEOUS) ×3 IMPLANT
NDL FILTER BLUNT 18X1 1/2 (NEEDLE) ×1 IMPLANT
NEEDLE FILTER BLUNT 18X 1/2SAF (NEEDLE) ×2
NEEDLE FILTER BLUNT 18X1 1/2 (NEEDLE) ×1 IMPLANT
PACK EYE AFTER SURG (MISCELLANEOUS) ×3 IMPLANT
PACK OPTHALMIC (MISCELLANEOUS) ×3 IMPLANT
PACK PORFILIO (MISCELLANEOUS) ×3 IMPLANT
SYR 3ML LL SCALE MARK (SYRINGE) ×3 IMPLANT
SYR TB 1ML LUER SLIP (SYRINGE) ×3 IMPLANT
WATER STERILE IRR 250ML POUR (IV SOLUTION) ×3 IMPLANT
WIPE NON LINTING 3.25X3.25 (MISCELLANEOUS) ×3 IMPLANT

## 2020-03-14 NOTE — H&P (Signed)
[facility]   Primary Care Physician:  System, Provider Not In Ophthalmologist: Dr. Druscilla Brownie  Pre-Procedure History & Physical: HPI:  Scott Parker is a 74 y.o. male here for cataract surgery.   Past Medical History:  Diagnosis Date  . Arthritis    spinal column  . COPD (chronic obstructive pulmonary disease) (HCC)   . Coronary artery disease   . Dyspnea    with exertion  . Dysrhythmia   . GERD (gastroesophageal reflux disease)   . Gout    right foot  . HOH (hard of hearing)   . Hypercholesterolemia   . Hyperlipemia   . Hypertension   . MRSA (methicillin resistant Staphylococcus aureus) 2016   HX of right lower leg  . Wears dentures    full upper, partial lower    Past Surgical History:  Procedure Laterality Date  . CARDIAC CATHETERIZATION  12/2019  . CATARACT EXTRACTION W/PHACO Left 02/22/2020   Procedure: CATARACT EXTRACTION PHACO AND INTRAOCULAR LENS PLACEMENT (IOC) LEFT 4.45 00:41.2;  Surgeon: Galen Manila, MD;  Location: [facility];  Service: Ophthalmology;  Laterality: Left;  . COLONOSCOPY     pt states he has had x 2  . fusion     cervical 3-7, repair ruptured disc  . JOINT REPLACEMENT Bilateral    bilateral hip replacements  . LEFT HEART CATH N/A 01/06/2020   Procedure: Left Heart Cath with possible intervention;  Surgeon: Laurier Nancy, MD;  Location: [facility]ASIVE CV [facility];  Service: Cardiovascular;  Laterality: N/A;  . TOTAL HIP ARTHROPLASTY     bilateral    Prior to Admission medications   Medication Sig Start Date End Date Taking? Authorizing Provider  albuterol (VENTOLIN HFA) 108 (90 Base) MCG/ACT inhaler Inhale into the lungs every 6 (six) hours as needed for wheezing or shortness of breath.   Yes [provider]  aspirin EC 81 MG tablet Take 81 mg by mouth daily. Swallow whole.   Yes [provider]  budesonide-formoterol (SYMBICORT) 160-4.5 MCG/ACT inhaler Inhale 2 puffs into the lungs daily.   Yes [provider]  Calcium Citrate (CITRACAL PO) Take 1 tablet by mouth daily.   Yes [provider]  Cinnamon 500 MG capsule Take 1,000 mg by mouth daily.   Yes [provider]  GARLIC PO Take 1 tablet by mouth daily.   Yes [provider]  hydrochlorothiazide (HYDRODIURIL) 25 MG tablet Take 25 mg by mouth daily. 12/07/19  Yes [provider]  ibuprofen (ADVIL) 200 MG tablet Take 200-400 mg by mouth See admin instructions. Take 400 mg in the morning and lunch and 200 mg in the evening   Yes [provider]  isosorbide mononitrate (IMDUR) 30 MG 24 hr tablet Take 30 mg by mouth daily.   Yes [provider]  metoprolol tartrate (LOPRESSOR) 50 MG tablet Take 50 mg by mouth 2 (two) times daily. 12/30/19  Yes [provider]  omega-3 acid ethyl esters (LOVAZA) 1 g capsule Take by mouth daily.   Yes [provider]  omeprazole (PRILOSEC) 20 MG capsule Take 20 mg by mouth daily.   Yes [provider]  rosuvastatin (CRESTOR) 20 MG tablet Take 40 mg by mouth daily.  12/27/19  Yes [provider]  zinc gluconate 50 MG tablet Take 50 mg by mouth daily.   Yes [provider]  acetaminophen (TYLENOL) 500 MG tablet Take 1,000 mg by mouth every 6 (six) hours as needed for mild pain or  moderate pain. Patient not taking: Reported on 03/14/2020    [provider]    Allergies as of 02/29/2020 - Review Complete 02/22/2020  Allergen Reaction Noted  . Codeine Other (See Comments) 07/22/2019    History reviewed. No pertinent family history.  Social History   Socioeconomic History  . Marital status: Divorced    Spouse name: Not on file  . Number of children: Not on file  . Years of education: Not on file  . Highest education level: Not on file  Occupational History  . Not on file  Tobacco Use  . Smoking status: Former Smoker    Types: Cigarettes    Quit date: 10/1995    Years since quitting: 24.3  .  Smokeless tobacco: Never Used  Vaping Use  . Vaping Use: Never used  Substance and Sexual Activity  . Alcohol use: Not Currently  . Drug use: Never  . Sexual activity: Not on file  Other Topics Concern  . Not on file  Social History Narrative  . Not on file   Social Determinants of Health   Financial Resource Strain: Not on file  Food Insecurity: Not on file  Transportation Needs: Not on file  Physical Activity: Not on file  Stress: Not on file  Social Connections: Not on file  Intimate Partner Violence: Not on file    Review of Systems: See HPI, otherwise negative ROS  Physical Exam: BP 126/89   Pulse 62   Temp (!) 97.1 F (36.2 C) (Temporal)   Resp 16   Ht 5\' 10"  (1.778 m)   Wt 80.3 kg   SpO2 100%   BMI 25.40 kg/m  General:   Alert,  pleasant and cooperative in NAD Head:  Normocephalic and atraumatic. Respiratory:  Normal work of breathing. Heart:  Regular rate and rhythm.   Impression/Plan: Scott Parker is here for cataract surgery.  Risks, benefits, limitations, and alternatives regarding cataract surgery have been reviewed with the patient.  Questions have been answered.  All parties agreeable.   Wannetta Sender, MD  03/14/2020, 10:57 AM

## 2020-03-14 NOTE — Anesthesia Preprocedure Evaluation (Signed)
Anesthesia Evaluation  Patient identified by MRN, date of birth, ID band Patient awake    Reviewed: Allergy & Precautions, NPO status , Patient's Chart, lab work & pertinent test results, reviewed documented beta blocker date and time   History of Anesthesia Complications Negative for: history of anesthetic complications  Airway Mallampati: III  TM Distance: >3 FB Neck ROM: Limited    Dental  (+) Upper Dentures, Partial Lower   Pulmonary COPD, former smoker,    breath sounds clear to auscultation       Cardiovascular hypertension, (-) angina+ CAD  (-) DOE  Rhythm:Regular Rate:Normal   HLD  LHC (12/2019): 1st Mrg 75% stenosed   Neuro/Psych    GI/Hepatic GERD  Controlled,  Endo/Other    Renal/GU      Musculoskeletal  (+) Arthritis ,   Abdominal   Peds  Hematology   Anesthesia Other Findings   Reproductive/Obstetrics                             Anesthesia Physical  Anesthesia Plan  ASA: II  Anesthesia Plan: MAC   Post-op Pain Management:    Induction: Intravenous  PONV Risk Score and Plan: 1 and TIVA, Midazolam and Treatment may vary due to age or medical condition  Airway Management Planned: Nasal Cannula  Additional Equipment:   Intra-op Plan:   Post-operative Plan:   Informed Consent: I have reviewed the patients History and Physical, chart, labs and discussed the procedure including the risks, benefits and alternatives for the proposed anesthesia with the patient or authorized representative who has indicated his/her understanding and acceptance.       Plan Discussed with: CRNA and Anesthesiologist  Anesthesia Plan Comments:         Anesthesia Quick Evaluation

## 2020-03-14 NOTE — Anesthesia Postprocedure Evaluation (Signed)
Anesthesia Post Note  Patient: Scott Parker  Procedure(s) Performed: CATARACT EXTRACTION PHACO AND INTRAOCULAR LENS PLACEMENT (IOC) RIGHT 6.06 00:50.4 (Right Eye)     Patient location during evaluation: PACU Anesthesia Type: MAC Level of consciousness: awake and alert Pain management: pain level controlled Vital Signs Assessment: post-procedure vital signs reviewed and stable Respiratory status: spontaneous breathing, nonlabored ventilation, respiratory function stable and patient connected to nasal cannula oxygen Cardiovascular status: stable and blood pressure returned to baseline Postop Assessment: no apparent nausea or vomiting Anesthetic complications: no   No complications documented.  Kinze Labo, Glade Stanford

## 2020-03-14 NOTE — Op Note (Signed)
PREOPERATIVE DIAGNOSIS:  Nuclear sclerotic cataract of the right eye.   POSTOPERATIVE DIAGNOSIS:  Cataract   OPERATIVE PROCEDURE:@   SURGEON:  Galen Manila, MD.   ANESTHESIA:  Anesthesiologist: Aldine Contes, MD CRNA: Maree Krabbe, CRNA  1.      Managed anesthesia care. 2.      0.78ml of Shugarcaine was instilled in the eye following the paracentesis.   COMPLICATIONS:  None.   TECHNIQUE:   Stop and chop   DESCRIPTION OF PROCEDURE:  The patient was examined and consented in the preoperative holding area where the aforementioned topical anesthesia was applied to the right eye and then brought back to the Operating Room where the right eye was prepped and draped in the usual sterile ophthalmic fashion and a lid speculum was placed. A paracentesis was created with the side port blade and the anterior chamber was filled with viscoelastic. A near clear corneal incision was performed with the steel keratome. A continuous curvilinear capsulorrhexis was performed with a cystotome followed by the capsulorrhexis forceps. Hydrodissection and hydrodelineation were carried out with BSS on a blunt cannula. The lens was removed in a stop and chop  technique and the remaining cortical material was removed with the irrigation-aspiration handpiece. The capsular bag was inflated with viscoelastic and the Technis [id number]  lens was placed in the capsular bag without complication. The remaining viscoelastic was removed from the eye with the irrigation-aspiration handpiece. The wounds were hydrated. The anterior chamber was flushed with BSS and the eye was inflated to physiologic pressure. 0.48ml of Vigamox was placed in the anterior chamber. The wounds were found to be water tight. The eye was dressed with Combigan. The patient was given protective glasses to wear throughout the day and a shield with which to sleep tonight. The patient was also given drops with which to begin a drop regimen today and will  follow-up with me in one day. Implant Name Type Inv. Item Serial No. Manufacturer Lot No. LRB No. Used Action  LENS IOL TECNIS EYHANCE 21.5 - [id number] Intraocular Lens LENS IOL TECNIS EYHANCE 21.5 [id number] [employer]   Right 1 Implanted   Procedure(s): CATARACT EXTRACTION PHACO AND INTRAOCULAR LENS PLACEMENT (IOC) RIGHT 6.06 00:50.4 (Right)  Electronically signed: Galen Manila 03/14/2020 11:24 AM

## 2020-03-14 NOTE — Anesthesia Procedure Notes (Signed)
Procedure Name: MAC Date/Time: 03/14/2020 11:09 AM Performed by: Cameron Ali, CRNA Pre-anesthesia Checklist: Patient identified, Emergency Drugs available, Suction available, Timeout performed and Patient being monitored Patient Re-evaluated:Patient Re-evaluated prior to induction Oxygen Delivery Method: Nasal cannula Placement Confirmation: positive ETCO2

## 2020-03-14 NOTE — Transfer of Care (Signed)
Immediate Anesthesia Transfer of Care Note  Patient: Scott Parker  Procedure(s) Performed: CATARACT EXTRACTION PHACO AND INTRAOCULAR LENS PLACEMENT (IOC) RIGHT 6.06 00:50.4 (Right Eye)  Patient Location: PACU  Anesthesia Type: MAC  Level of Consciousness: awake, alert  and patient cooperative  Airway and Oxygen Therapy: Patient Spontanous Breathing and Patient connected to supplemental oxygen  Post-op Assessment: Post-op Vital signs reviewed, Patient's Cardiovascular Status Stable, Respiratory Function Stable, Patent Airway and No signs of Nausea or vomiting  Post-op Vital Signs: Reviewed and stable  Complications: No complications documented.

## 2020-03-15 ENCOUNTER — Encounter: Payer: Self-pay | Admitting: Ophthalmology

## 2020-06-01 ENCOUNTER — Ambulatory Visit (INDEPENDENT_AMBULATORY_CARE_PROVIDER_SITE_OTHER): Payer: Medicare Other | Admitting: Vascular Surgery

## 2020-06-01 ENCOUNTER — Encounter (INDEPENDENT_AMBULATORY_CARE_PROVIDER_SITE_OTHER): Payer: Self-pay | Admitting: Vascular Surgery

## 2020-06-01 ENCOUNTER — Other Ambulatory Visit: Payer: Self-pay

## 2020-06-01 DIAGNOSIS — E782 Mixed hyperlipidemia: Secondary | ICD-10-CM | POA: Diagnosis not present

## 2020-06-01 DIAGNOSIS — I10 Essential (primary) hypertension: Secondary | ICD-10-CM | POA: Diagnosis not present

## 2020-06-01 DIAGNOSIS — I25118 Atherosclerotic heart disease of native coronary artery with other forms of angina pectoris: Secondary | ICD-10-CM

## 2020-06-01 DIAGNOSIS — K219 Gastro-esophageal reflux disease without esophagitis: Secondary | ICD-10-CM

## 2020-06-01 DIAGNOSIS — S62509A Fracture of unspecified phalanx of unspecified thumb, initial encounter for closed fracture: Secondary | ICD-10-CM | POA: Insufficient documentation

## 2020-06-01 DIAGNOSIS — I6529 Occlusion and stenosis of unspecified carotid artery: Secondary | ICD-10-CM | POA: Insufficient documentation

## 2020-06-01 DIAGNOSIS — I6523 Occlusion and stenosis of bilateral carotid arteries: Secondary | ICD-10-CM

## 2020-06-01 MED ORDER — CLOPIDOGREL BISULFATE 75 MG PO TABS: 75.0000 mg | ORAL_TABLET | Freq: Every day | ORAL | 11 refills | Status: DC

## 2020-06-01 MED ORDER — PANTOPRAZOLE SODIUM 40 MG PO TBEC: 40.0000 mg | DELAYED_RELEASE_TABLET | Freq: Every day | ORAL | 11 refills | Status: AC

## 2020-06-02 ENCOUNTER — Telehealth (INDEPENDENT_AMBULATORY_CARE_PROVIDER_SITE_OTHER): Payer: Self-pay

## 2020-06-02 NOTE — Telephone Encounter (Signed)
Spoke with the patient and he is scheduled with Dr. Gilda Crease for a left carotid stent on 06/14/20 with a 12:30 pm arrival to the [facility]. Covid testing on 06/12/20 between 8-1 pm at the [facility]. Pre-procedure were discussed and will be mailed.

## 2020-06-04 ENCOUNTER — Encounter (INDEPENDENT_AMBULATORY_CARE_PROVIDER_SITE_OTHER): Payer: Self-pay | Admitting: Vascular Surgery

## 2020-06-04 DIAGNOSIS — K219 Gastro-esophageal reflux disease without esophagitis: Secondary | ICD-10-CM | POA: Insufficient documentation

## 2020-06-04 DIAGNOSIS — I2510 Atherosclerotic heart disease of native coronary artery without angina pectoris: Secondary | ICD-10-CM | POA: Insufficient documentation

## 2020-06-04 DIAGNOSIS — I10 Essential (primary) hypertension: Secondary | ICD-10-CM | POA: Insufficient documentation

## 2020-06-04 DIAGNOSIS — E785 Hyperlipidemia, unspecified: Secondary | ICD-10-CM | POA: Insufficient documentation

## 2020-06-04 NOTE — Progress Notes (Signed)
  MRN : [id number]  Scott Parker is a 75 y.o. (12/26/1945) male who presents with chief complaint of No chief complaint on file. .  History of Present Illness:   The patient is seen for evaluation of carotid stenosis. The carotid stenosis was identified after he experienced global symptoms on several occasions.  The patient denies amaurosis fugax. There is no recent history of TIA symptoms or focal motor deficits. There is no prior documented CVA.  There is no history of migraine headaches. There is no history of seizures.  The patient is taking enteric-coated aspirin 81 mg daily.  The patient has a history of coronary artery disease, + recent episodes of angina but no shortness of breath.  He recently had a cardiac cath by Dr Khan and he has nonreconstructable coronary disease.  The patient denies PAD or claudication symptoms. There is a history of hyperlipidemia which is being treated with a statin.    Current Meds  Medication Sig  . budesonide-formoterol (SYMBICORT) 160-4.5 MCG/ACT inhaler Inhale 1 puff into the lungs daily.  . Calcium Citrate (CITRACAL PO) Take 1 tablet by mouth daily.  . Cinnamon 500 MG capsule Take 1,000 mg by mouth daily.  . clopidogrel (PLAVIX) 75 MG tablet Take 1 tablet (75 mg total) by mouth daily.  . GARLIC PO Take 1 tablet by mouth daily.  . hydrochlorothiazide (HYDRODIURIL) 25 MG tablet Take 25 mg by mouth daily.  . isosorbide mononitrate (IMDUR) 30 MG 24 hr tablet Take 30 mg by mouth at bedtime.  . metoprolol tartrate (LOPRESSOR) 50 MG tablet Take 50 mg by mouth 2 (two) times daily.  . omega-3 acid ethyl esters (LOVAZA) 1 g capsule Take 1 g by mouth daily.  . pantoprazole (PROTONIX) 40 MG tablet Take 1 tablet (40 mg total) by mouth daily.  . rosuvastatin (CRESTOR) 40 MG tablet Take 40 mg by mouth at bedtime.  . zinc gluconate 50 MG tablet Take 50 mg by mouth in the morning and at bedtime.  . [DISCONTINUED] clopidogrel (PLAVIX) 75 MG tablet Take 1  tablet (75 mg total) by mouth daily.    Past Medical History:  Diagnosis Date  . Arthritis    spinal column  . COPD (chronic obstructive pulmonary disease) (HCC)   . Coronary artery disease   . Dyspnea    with exertion  . Dysrhythmia   . GERD (gastroesophageal reflux disease)   . Gout    right foot  . HOH (hard of hearing)   . Hypercholesterolemia   . Hyperlipemia   . Hypertension   . MRSA (methicillin resistant Staphylococcus aureus) 2016   HX of right lower leg  . Wears dentures    full upper, partial lower    Past Surgical History:  Procedure Laterality Date  . CARDIAC CATHETERIZATION  12/2019  . CATARACT EXTRACTION W/PHACO Left 02/22/2020   Procedure: CATARACT EXTRACTION PHACO AND INTRAOCULAR LENS PLACEMENT (IOC) LEFT 4.45 00:41.2;  Surgeon: Porfilio, William, MD;  Location: [facility];  Service: Ophthalmology;  Laterality: Left;  . CATARACT EXTRACTION W/PHACO Right 03/14/2020   Procedure: CATARACT EXTRACTION PHACO AND INTRAOCULAR LENS PLACEMENT (IOC) RIGHT 6.06 00:50.4;  Surgeon: Porfilio, William, MD;  Location: [facility];  Service: Ophthalmology;  Laterality: Right;  . COLONOSCOPY     pt states he has had x 2  . fusion     cervical 3-7, repair ruptured disc  . JOINT REPLACEMENT Bilateral    bilateral hip replacements  . LEFT HEART CATH N/A 01/06/2020     Procedure: Left Heart Cath with possible intervention;  Surgeon: Laurier Nancy, MD;  Location: [facility] INVASIVE CV LAB;  Service: Cardiovascular;  Laterality: N/A;  . TOTAL HIP ARTHROPLASTY     bilateral    Social History Social History   Tobacco Use  . Smoking status: Former Smoker    Types: Cigarettes    Quit date: 10/1995    Years since quitting: 24.6  . Smokeless tobacco: Never Used  Vaping Use  . Vaping Use: Never used  Substance Use Topics  . Alcohol use: Not Currently  . Drug use: Never    Family History No family history of bleeding/clotting disorders, porphyria or autoimmune  disease   Allergies  Allergen Reactions  . Codeine Other (See Comments)    Reports has taken without problems     REVIEW OF SYSTEMS (Negative unless checked)  Constitutional: [] Weight loss  [] Fever  [] Chills Cardiac: [] Chest pain   [] Chest pressure   [] Palpitations   [] Shortness of breath when laying flat   [] Shortness of breath with exertion. Vascular:  [] Pain in legs with walking   [] Pain in legs at rest  [] History of DVT   [] Phlebitis   [] Swelling in legs   [] Varicose veins   [] Non-healing ulcers Pulmonary:   [] Uses home oxygen   [] Productive cough   [] Hemoptysis   [] Wheeze  [] COPD   [] Asthma Neurologic:  [x] Dizziness   [] Seizures   [] History of stroke   [x] History of TIA  [] Aphasia   [] Vissual changes   [x] Weakness or numbness in arm   [x] Weakness or numbness in leg Musculoskeletal:   [] Joint swelling   [x] Joint pain   [] Low back pain Hematologic:  [] Easy bruising  [] Easy bleeding   [] Hypercoagulable state   [] Anemic Gastrointestinal:  [] Diarrhea   [] Vomiting  [x] Gastroesophageal reflux/heartburn   [] Difficulty swallowing. Genitourinary:  [] Chronic kidney disease   [] Difficult urination  [] Frequent urination   [] Blood in urine Skin:  [] Rashes   [] Ulcers  Psychological:  [] History of anxiety   []  History of major depression.  Physical Examination  Vitals:   06/01/20 1537  BP: 122/77  Pulse: 76  Weight: 175 lb (79.4 kg)  Height: 5\' 10"  (1.778 m)   Body mass index is 25.11 kg/m. Gen: WD/WN, NAD Head: [place]/AT, No temporalis wasting.  Ear/Nose/Throat: Hearing grossly intact, nares w/o erythema or drainage, poor dentition Eyes: PER, EOMI, sclera nonicteric.  Neck: Supple, no masses.  No bruit or JVD.  Pulmonary:  Good air movement, clear to auscultation bilaterally, no use of accessory muscles.  Cardiac: RRR, normal S1, S2, no Murmurs. Vascular: no bruits Vessel Right Left  Radial Palpable Palpable  Brachial Palpable Palpable  Carotid Palpable Palpable  Gastrointestinal:  soft, non-distended. No guarding/no peritoneal signs.  Musculoskeletal: M/S 5/5 throughout.  No deformity or atrophy.  Neurologic: CN 2-12 intact. Pain and light touch intact in extremities.  Symmetrical.  Speech is fluent. Motor exam as listed above. Psychiatric: Judgment intact, Mood & affect appropriate for pt's clinical situation. Dermatologic: No rashes or ulcers noted.  No changes consistent with cellulitis.  CBC Lab Results  Component Value Date   WBC 13.8 (H) 12/06/2019   HGB 14.6 12/06/2019   HCT 41.3 12/06/2019   MCV 87.7 12/06/2019   PLT 205 12/06/2019    BMET    Component Value Date/Time   NA 136 12/06/2019 1848   K 3.4 (L) 12/06/2019 1848   CL 99 12/06/2019 1848   CO2 23 12/06/2019 1848   GLUCOSE 189 (H) 12/06/2019 1848  BUN 22 12/06/2019 1848   CREATININE 1.17 12/06/2019 1848   CALCIUM 8.5 (L) 12/06/2019 1848   GFRNONAA >60 12/06/2019 1848   GFRAA >60 12/06/2019 1848   CrCl cannot be calculated (Patient's most recent lab result is older than the maximum 21 days allowed.).  COAG No results found for: INR, PROTIME  Radiology No results found.   Assessment/Plan 1. 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  The patient now has progressed and has a lesion the is >70%.  Patient's CT angiography of the carotid arteries confirms >80% bilateral ICA stenosis.  The anatomical considerations support stenting over surgery.  This was discussed in detail with the patient.  The risks, benefits and alternative therapies were reviewed in detail with the patient.  All questions were answered.  The patient agrees to proceed with stenting of the left carotid artery first and then the right ICA will be addressed  Continue antiplatelet therapy as prescribed. Continue management of CAD, HTN and Hyperlipidemia. Healthy heart diet, encouraged exercise at least 4 times per week.   2.  Coronary artery disease of native artery of native heart with stable angina pectoris (HCC) Continue cardiac and antihypertensive medications as already ordered and reviewed, no changes at this time.  Continue statin as ordered and reviewed, no changes at this time  Nitrates PRN for chest pain   3. Essential hypertension Continue antihypertensive medications as already ordered, these medications have been reviewed and there are no changes at this time.   4. Mixed hyperlipidemia Continue statin as ordered and reviewed, no changes at this time   5. Gastroesophageal reflux disease without esophagitis Continue PPI as already ordered, this medication has been reviewed and there are no changes at this time.  Avoidence of caffeine and alcohol  Moderate elevation of the head of the bed     Levora Dredge, MD  06/04/2020 1:59 PM

## 2020-06-04 NOTE — H&P (View-Only) (Signed)
MRN : [id number]  Scott Parker is a 75 y.o. (February 27, 1946) male who presents with chief complaint of No chief complaint on file. Marland Kitchen  History of Present Illness:   The patient is seen for evaluation of carotid stenosis. The carotid stenosis was identified after he experienced global symptoms on several occasions.  The patient denies amaurosis fugax. There is no recent history of TIA symptoms or focal motor deficits. There is no prior documented CVA.  There is no history of migraine headaches. There is no history of seizures.  The patient is taking enteric-coated aspirin 81 mg daily.  The patient has a history of coronary artery disease, + recent episodes of angina but no shortness of breath.  He recently had a cardiac cath by Dr Welton Flakes and he has nonreconstructable coronary disease.  The patient denies PAD or claudication symptoms. There is a history of hyperlipidemia which is being treated with a statin.    Current Meds  Medication Sig  . budesonide-formoterol (SYMBICORT) 160-4.5 MCG/ACT inhaler Inhale 1 puff into the lungs daily.  . Calcium Citrate (CITRACAL PO) Take 1 tablet by mouth daily.  . Cinnamon 500 MG capsule Take 1,000 mg by mouth daily.  . clopidogrel (PLAVIX) 75 MG tablet Take 1 tablet (75 mg total) by mouth daily.  Marland Kitchen GARLIC PO Take 1 tablet by mouth daily.  . hydrochlorothiazide (HYDRODIURIL) 25 MG tablet Take 25 mg by mouth daily.  . isosorbide mononitrate (IMDUR) 30 MG 24 hr tablet Take 30 mg by mouth at bedtime.  . metoprolol tartrate (LOPRESSOR) 50 MG tablet Take 50 mg by mouth 2 (two) times daily.  Marland Kitchen omega-3 acid ethyl esters (LOVAZA) 1 g capsule Take 1 g by mouth daily.  . pantoprazole (PROTONIX) 40 MG tablet Take 1 tablet (40 mg total) by mouth daily.  . rosuvastatin (CRESTOR) 40 MG tablet Take 40 mg by mouth at bedtime.  Marland Kitchen zinc gluconate 50 MG tablet Take 50 mg by mouth in the morning and at bedtime.  . [DISCONTINUED] clopidogrel (PLAVIX) 75 MG tablet Take 1  tablet (75 mg total) by mouth daily.    Past Medical History:  Diagnosis Date  . Arthritis    spinal column  . COPD (chronic obstructive pulmonary disease) (HCC)   . Coronary artery disease   . Dyspnea    with exertion  . Dysrhythmia   . GERD (gastroesophageal reflux disease)   . Gout    right foot  . HOH (hard of hearing)   . Hypercholesterolemia   . Hyperlipemia   . Hypertension   . MRSA (methicillin resistant Staphylococcus aureus) 2016   HX of right lower leg  . Wears dentures    full upper, partial lower    Past Surgical History:  Procedure Laterality Date  . CARDIAC CATHETERIZATION  12/2019  . CATARACT EXTRACTION W/PHACO Left 02/22/2020   Procedure: CATARACT EXTRACTION PHACO AND INTRAOCULAR LENS PLACEMENT (IOC) LEFT 4.45 00:41.2;  Surgeon: Galen Manila, MD;  Location: [facility];  Service: Ophthalmology;  Laterality: Left;  . CATARACT EXTRACTION W/PHACO Right 03/14/2020   Procedure: CATARACT EXTRACTION PHACO AND INTRAOCULAR LENS PLACEMENT (IOC) RIGHT 6.06 00:50.4;  Surgeon: Galen Manila, MD;  Location: [facility];  Service: Ophthalmology;  Laterality: Right;  . COLONOSCOPY     pt states he has had x 2  . fusion     cervical 3-7, repair ruptured disc  . JOINT REPLACEMENT Bilateral    bilateral hip replacements  . LEFT HEART CATH N/A 01/06/2020  Procedure: Left Heart Cath with possible intervention;  Surgeon: Laurier Nancy, MD;  Location: [facility] INVASIVE CV LAB;  Service: Cardiovascular;  Laterality: N/A;  . TOTAL HIP ARTHROPLASTY     bilateral    Social History Social History   Tobacco Use  . Smoking status: Former Smoker    Types: Cigarettes    Quit date: 10/1995    Years since quitting: 24.6  . Smokeless tobacco: Never Used  Vaping Use  . Vaping Use: Never used  Substance Use Topics  . Alcohol use: Not Currently  . Drug use: Never    Family History No family history of bleeding/clotting disorders, porphyria or autoimmune  disease   Allergies  Allergen Reactions  . Codeine Other (See Comments)    Reports has taken without problems     REVIEW OF SYSTEMS (Negative unless checked)  Constitutional: [] Weight loss  [] Fever  [] Chills Cardiac: [] Chest pain   [] Chest pressure   [] Palpitations   [] Shortness of breath when laying flat   [] Shortness of breath with exertion. Vascular:  [] Pain in legs with walking   [] Pain in legs at rest  [] History of DVT   [] Phlebitis   [] Swelling in legs   [] Varicose veins   [] Non-healing ulcers Pulmonary:   [] Uses home oxygen   [] Productive cough   [] Hemoptysis   [] Wheeze  [] COPD   [] Asthma Neurologic:  [x] Dizziness   [] Seizures   [] History of stroke   [x] History of TIA  [] Aphasia   [] Vissual changes   [x] Weakness or numbness in arm   [x] Weakness or numbness in leg Musculoskeletal:   [] Joint swelling   [x] Joint pain   [] Low back pain Hematologic:  [] Easy bruising  [] Easy bleeding   [] Hypercoagulable state   [] Anemic Gastrointestinal:  [] Diarrhea   [] Vomiting  [x] Gastroesophageal reflux/heartburn   [] Difficulty swallowing. Genitourinary:  [] Chronic kidney disease   [] Difficult urination  [] Frequent urination   [] Blood in urine Skin:  [] Rashes   [] Ulcers  Psychological:  [] History of anxiety   []  History of major depression.  Physical Examination  Vitals:   06/01/20 1537  BP: 122/77  Pulse: 76  Weight: 175 lb (79.4 kg)  Height: 5\' 10"  (1.778 m)   Body mass index is 25.11 kg/m. Gen: WD/WN, NAD Head: [place]/AT, No temporalis wasting.  Ear/Nose/Throat: Hearing grossly intact, nares w/o erythema or drainage, poor dentition Eyes: PER, EOMI, sclera nonicteric.  Neck: Supple, no masses.  No bruit or JVD.  Pulmonary:  Good air movement, clear to auscultation bilaterally, no use of accessory muscles.  Cardiac: RRR, normal S1, S2, no Murmurs. Vascular: no bruits Vessel Right Left  Radial Palpable Palpable  Brachial Palpable Palpable  Carotid Palpable Palpable  Gastrointestinal:  soft, non-distended. No guarding/no peritoneal signs.  Musculoskeletal: M/S 5/5 throughout.  No deformity or atrophy.  Neurologic: CN 2-12 intact. Pain and light touch intact in extremities.  Symmetrical.  Speech is fluent. Motor exam as listed above. Psychiatric: Judgment intact, Mood & affect appropriate for pt's clinical situation. Dermatologic: No rashes or ulcers noted.  No changes consistent with cellulitis.  CBC Lab Results  Component Value Date   WBC 13.8 (H) 12/06/2019   HGB 14.6 12/06/2019   HCT 41.3 12/06/2019   MCV 87.7 12/06/2019   PLT 205 12/06/2019    BMET    Component Value Date/Time   NA 136 12/06/2019 1848   K 3.4 (L) 12/06/2019 1848   CL 99 12/06/2019 1848   CO2 23 12/06/2019 1848   GLUCOSE 189 (H) 12/06/2019 1848  BUN 22 12/06/2019 1848   CREATININE 1.17 12/06/2019 1848   CALCIUM 8.5 (L) 12/06/2019 1848   GFRNONAA >60 12/06/2019 1848   GFRAA >60 12/06/2019 1848   CrCl cannot be calculated (Patient's most recent lab result is older than the maximum 21 days allowed.).  COAG No results found for: INR, PROTIME  Radiology No results found.   Assessment/Plan 1. 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  The patient now has progressed and has a lesion the is >70%.  Patient's CT angiography of the carotid arteries confirms >80% bilateral ICA stenosis.  The anatomical considerations support stenting over surgery.  This was discussed in detail with the patient.  The risks, benefits and alternative therapies were reviewed in detail with the patient.  All questions were answered.  The patient agrees to proceed with stenting of the left carotid artery first and then the right ICA will be addressed  Continue antiplatelet therapy as prescribed. Continue management of CAD, HTN and Hyperlipidemia. Healthy heart diet, encouraged exercise at least 4 times per week.   2.  Coronary artery disease of native artery of native heart with stable angina pectoris (HCC) Continue cardiac and antihypertensive medications as already ordered and reviewed, no changes at this time.  Continue statin as ordered and reviewed, no changes at this time  Nitrates PRN for chest pain   3. Essential hypertension Continue antihypertensive medications as already ordered, these medications have been reviewed and there are no changes at this time.   4. Mixed hyperlipidemia Continue statin as ordered and reviewed, no changes at this time   5. Gastroesophageal reflux disease without esophagitis Continue PPI as already ordered, this medication has been reviewed and there are no changes at this time.  Avoidence of caffeine and alcohol  Moderate elevation of the head of the bed     Levora Dredge, MD  06/04/2020 1:59 PM

## 2020-06-12 ENCOUNTER — Other Ambulatory Visit: Payer: Self-pay

## 2020-06-12 ENCOUNTER — Other Ambulatory Visit
Admission: RE | Admit: 2020-06-12 | Discharge: 2020-06-12 | Disposition: A | Discharge status: Home / Self Care | Payer: Medicare Other | Source: Ambulatory Visit | Attending: Vascular Surgery | Admitting: Vascular Surgery

## 2020-06-12 DIAGNOSIS — Z01812 Encounter for preprocedural laboratory examination: Secondary | ICD-10-CM | POA: Diagnosis present

## 2020-06-12 DIAGNOSIS — Z20822 Contact with and (suspected) exposure to covid-19: Secondary | ICD-10-CM | POA: Insufficient documentation

## 2020-06-12 LAB — SARS CORONAVIRUS 2 (TAT 6-24 HRS): SARS Coronavirus 2: NEGATIVE

## 2020-06-13 ENCOUNTER — Other Ambulatory Visit (INDEPENDENT_AMBULATORY_CARE_PROVIDER_SITE_OTHER): Payer: Self-pay | Admitting: Nurse Practitioner

## 2020-06-14 ENCOUNTER — Ambulatory Visit
Admission: RE | Admit: 2020-06-14 | Discharge: 2020-06-14 | Disposition: A | Discharge status: Home / Self Care | Payer: Medicare Other | Attending: Vascular Surgery | Admitting: Vascular Surgery

## 2020-06-14 ENCOUNTER — Other Ambulatory Visit: Payer: Self-pay

## 2020-06-14 ENCOUNTER — Encounter: Payer: Self-pay | Admitting: Vascular Surgery

## 2020-06-14 ENCOUNTER — Encounter
Admission: RE | Disposition: A | Discharge status: Home / Self Care | Payer: Self-pay | Source: Home / Self Care | Attending: Vascular Surgery

## 2020-06-14 DIAGNOSIS — I6522 Occlusion and stenosis of left carotid artery: Secondary | ICD-10-CM

## 2020-06-14 DIAGNOSIS — Z539 Procedure and treatment not carried out, unspecified reason: Secondary | ICD-10-CM | POA: Diagnosis present

## 2020-06-14 LAB — CREATININE, SERUM
Creatinine, Ser: 0.87 mg/dL (ref 0.61–1.24)
GFR, Estimated: >60 mL/min (ref >60)

## 2020-06-14 LAB — BUN: BUN: 18 mg/dL (ref 8–23)

## 2020-06-14 SURGERY — CAROTID PTA/STENT INTERVENTION
Anesthesia: Moderate Sedation | Laterality: Left

## 2020-06-14 MED ORDER — FAMOTIDINE 20 MG PO TABS: 40.0000 mg | ORAL_TABLET | Freq: Once | ORAL | Status: DC | PRN

## 2020-06-14 MED ORDER — CEFAZOLIN SODIUM-DEXTROSE 2-4 GM/100ML-% IV SOLN
INTRAVENOUS | Status: AC
  Filled 2020-06-14: qty 100

## 2020-06-14 MED ORDER — HYDROMORPHONE HCL 1 MG/ML IJ SOLN: 1.0000 mg | Freq: Once | INTRAMUSCULAR | Status: DC | PRN

## 2020-06-14 MED ORDER — ONDANSETRON HCL 4 MG/2ML IJ SOLN: 4.0000 mg | Freq: Four times a day (QID) | INTRAMUSCULAR | Status: DC | PRN

## 2020-06-14 MED ORDER — MIDAZOLAM HCL 2 MG/ML PO SYRP: 8.0000 mg | ORAL_SOLUTION | Freq: Once | ORAL | Status: DC | PRN

## 2020-06-14 MED ORDER — METHYLPREDNISOLONE SODIUM SUCC 125 MG IJ SOLR: 125.0000 mg | Freq: Once | INTRAMUSCULAR | Status: DC | PRN

## 2020-06-14 MED ORDER — CEFAZOLIN SODIUM-DEXTROSE 2-4 GM/100ML-% IV SOLN: 2.0000 g | Freq: Once | INTRAVENOUS | Status: DC

## 2020-06-14 MED ORDER — DOPAMINE-DEXTROSE 3.2-5 MG/ML-% IV SOLN
INTRAVENOUS | Status: AC
  Filled 2020-06-14: qty 250

## 2020-06-14 MED ORDER — SODIUM CHLORIDE 0.9 % IV SOLN: INTRAVENOUS | Status: DC

## 2020-06-14 MED ORDER — DIPHENHYDRAMINE HCL 50 MG/ML IJ SOLN: 50.0000 mg | Freq: Once | INTRAMUSCULAR | Status: DC | PRN

## 2020-06-14 NOTE — OR Nursing (Signed)
Procedure cancelled due to Doctor involved with an emergency procedure. Pt and friend aware understanding that his procedure to be rescheduled.

## 2020-06-14 NOTE — Discharge Instructions (Signed)
Our office will reach out to you in regard to scheduling another date to undergo your carotid procedure

## 2020-06-15 ENCOUNTER — Encounter (INDEPENDENT_AMBULATORY_CARE_PROVIDER_SITE_OTHER): Payer: Self-pay

## 2020-06-15 ENCOUNTER — Telehealth (INDEPENDENT_AMBULATORY_CARE_PROVIDER_SITE_OTHER): Payer: Self-pay

## 2020-06-15 NOTE — Telephone Encounter (Signed)
I attempted to contact the patient to reschedule his left carotid stent placement. Patient has been rescheduled to 06/28/20 with a 11:00 am arrival time to the [facility]. Covid testing on 06/26/20 between 8-2 pm at the [facility]. A message was left for a return call.

## 2020-06-20 NOTE — Telephone Encounter (Signed)
Spoke with the patient and he is rescheduled with Dr. Gilda Crease for a left carotid stent on 06/28/20 with a 9:00 am arrival time to the [facility]. Covid testing on 06/26/20 between 8-2 pm at the [facility]. Pre-procedure instructions were discussed and will eill be mailed.

## 2020-06-26 ENCOUNTER — Other Ambulatory Visit
Admission: RE | Admit: 2020-06-26 | Discharge: 2020-06-26 | Disposition: A | Discharge status: Home / Self Care | Payer: Medicare Other | Source: Ambulatory Visit | Attending: Vascular Surgery | Admitting: Vascular Surgery

## 2020-06-26 ENCOUNTER — Other Ambulatory Visit: Payer: Self-pay

## 2020-06-26 DIAGNOSIS — Z20822 Contact with and (suspected) exposure to covid-19: Secondary | ICD-10-CM | POA: Diagnosis not present

## 2020-06-26 DIAGNOSIS — Z01812 Encounter for preprocedural laboratory examination: Secondary | ICD-10-CM | POA: Insufficient documentation

## 2020-06-26 LAB — SARS CORONAVIRUS 2 (TAT 6-24 HRS): SARS Coronavirus 2: NEGATIVE

## 2020-06-28 ENCOUNTER — Ambulatory Visit
Admission: RE | Admit: 2020-06-28 | Discharge: 2020-06-28 | Disposition: A | Discharge status: Home / Self Care | Payer: Medicare Other | Attending: Vascular Surgery | Admitting: Vascular Surgery

## 2020-06-28 ENCOUNTER — Encounter
Admission: RE | Disposition: A | Discharge status: Home / Self Care | Payer: Self-pay | Source: Home / Self Care | Attending: Vascular Surgery

## 2020-06-28 ENCOUNTER — Other Ambulatory Visit (INDEPENDENT_AMBULATORY_CARE_PROVIDER_SITE_OTHER): Payer: Self-pay | Admitting: Nurse Practitioner

## 2020-06-28 ENCOUNTER — Other Ambulatory Visit: Payer: Self-pay

## 2020-06-28 ENCOUNTER — Encounter: Payer: Self-pay | Admitting: Vascular Surgery

## 2020-06-28 DIAGNOSIS — Z7951 Long term (current) use of inhaled steroids: Secondary | ICD-10-CM | POA: Diagnosis not present

## 2020-06-28 DIAGNOSIS — E7800 Pure hypercholesterolemia, unspecified: Secondary | ICD-10-CM | POA: Diagnosis not present

## 2020-06-28 DIAGNOSIS — Z79899 Other long term (current) drug therapy: Secondary | ICD-10-CM

## 2020-06-28 DIAGNOSIS — J449 Chronic obstructive pulmonary disease, unspecified: Secondary | ICD-10-CM | POA: Diagnosis present

## 2020-06-28 DIAGNOSIS — I25118 Atherosclerotic heart disease of native coronary artery with other forms of angina pectoris: Secondary | ICD-10-CM | POA: Diagnosis present

## 2020-06-28 DIAGNOSIS — K219 Gastro-esophageal reflux disease without esophagitis: Secondary | ICD-10-CM | POA: Diagnosis present

## 2020-06-28 DIAGNOSIS — R4701 Aphasia: Secondary | ICD-10-CM | POA: Diagnosis not present

## 2020-06-28 DIAGNOSIS — Z96643 Presence of artificial hip joint, bilateral: Secondary | ICD-10-CM | POA: Diagnosis not present

## 2020-06-28 DIAGNOSIS — I6523 Occlusion and stenosis of bilateral carotid arteries: Secondary | ICD-10-CM | POA: Diagnosis present

## 2020-06-28 DIAGNOSIS — Z981 Arthrodesis status: Secondary | ICD-10-CM

## 2020-06-28 DIAGNOSIS — H9190 Unspecified hearing loss, unspecified ear: Secondary | ICD-10-CM | POA: Diagnosis not present

## 2020-06-28 DIAGNOSIS — Z7902 Long term (current) use of antithrombotics/antiplatelets: Secondary | ICD-10-CM

## 2020-06-28 DIAGNOSIS — Z87891 Personal history of nicotine dependence: Secondary | ICD-10-CM

## 2020-06-28 DIAGNOSIS — R55 Syncope and collapse: Secondary | ICD-10-CM | POA: Diagnosis not present

## 2020-06-28 DIAGNOSIS — I10 Essential (primary) hypertension: Secondary | ICD-10-CM | POA: Diagnosis present

## 2020-06-28 DIAGNOSIS — I6522 Occlusion and stenosis of left carotid artery: Secondary | ICD-10-CM

## 2020-06-28 DIAGNOSIS — I6529 Occlusion and stenosis of unspecified carotid artery: Secondary | ICD-10-CM | POA: Diagnosis present

## 2020-06-28 HISTORY — PX: CAROTID PTA/STENT INTERVENTION: CATH118231

## 2020-06-28 LAB — CREATININE, SERUM
Creatinine, Ser: 1.02 mg/dL (ref 0.61–1.24)
GFR, Estimated: >60 mL/min (ref >60)

## 2020-06-28 LAB — BUN: BUN: 20 mg/dL (ref 8–23)

## 2020-06-28 SURGERY — CAROTID PTA/STENT INTERVENTION
Anesthesia: Moderate Sedation | Laterality: Left

## 2020-06-28 MED ORDER — FENTANYL CITRATE (PF) 100 MCG/2ML IJ SOLN
INTRAMUSCULAR | Status: AC
  Filled 2020-06-28: qty 2

## 2020-06-28 MED ORDER — SODIUM CHLORIDE 0.9 % IV SOLN: 250.0000 mL | INTRAVENOUS | Status: DC | PRN

## 2020-06-28 MED ORDER — HYDRALAZINE HCL 20 MG/ML IJ SOLN: 5.0000 mg | INTRAMUSCULAR | Status: DC | PRN

## 2020-06-28 MED ORDER — FENTANYL CITRATE (PF) 100 MCG/2ML IJ SOLN
INTRAMUSCULAR | Status: DC | PRN
  Administered 2020-06-28: 50 ug via INTRAVENOUS

## 2020-06-28 MED ORDER — SODIUM CHLORIDE 0.9 % IV SOLN: INTRAVENOUS | Status: DC

## 2020-06-28 MED ORDER — MIDAZOLAM HCL 2 MG/2ML IJ SOLN
INTRAMUSCULAR | Status: DC | PRN
  Administered 2020-06-28: 2 mg via INTRAVENOUS

## 2020-06-28 MED ORDER — LABETALOL HCL 5 MG/ML IV SOLN: 10.0000 mg | INTRAVENOUS | Status: DC | PRN

## 2020-06-28 MED ORDER — SODIUM CHLORIDE 0.9% FLUSH: 3.0000 mL | INTRAVENOUS | Status: DC | PRN

## 2020-06-28 MED ORDER — CEFAZOLIN SODIUM-DEXTROSE 2-4 GM/100ML-% IV SOLN
INTRAVENOUS | Status: AC
  Filled 2020-06-28: qty 100

## 2020-06-28 MED ORDER — HYDROMORPHONE HCL 1 MG/ML IJ SOLN: 1.0000 mg | Freq: Once | INTRAMUSCULAR | Status: DC | PRN

## 2020-06-28 MED ORDER — OXYCODONE HCL 5 MG PO TABS: 5.0000 mg | ORAL_TABLET | ORAL | Status: DC | PRN

## 2020-06-28 MED ORDER — METHYLPREDNISOLONE SODIUM SUCC 125 MG IJ SOLR: 125.0000 mg | Freq: Once | INTRAMUSCULAR | Status: DC | PRN

## 2020-06-28 MED ORDER — HEPARIN SODIUM (PORCINE) 1000 UNIT/ML IJ SOLN
INTRAMUSCULAR | Status: DC | PRN
  Administered 2020-06-28: 7000 [IU] via INTRAVENOUS

## 2020-06-28 MED ORDER — CEFAZOLIN SODIUM-DEXTROSE 2-4 GM/100ML-% IV SOLN: 2.0000 g | Freq: Once | INTRAVENOUS | Status: DC

## 2020-06-28 MED ORDER — FAMOTIDINE 20 MG PO TABS: 40.0000 mg | ORAL_TABLET | Freq: Once | ORAL | Status: DC | PRN

## 2020-06-28 MED ORDER — PHENYLEPHRINE HCL (PRESSORS) 10 MG/ML IV SOLN
INTRAVENOUS | Status: AC
  Filled 2020-06-28: qty 1

## 2020-06-28 MED ORDER — MIDAZOLAM HCL 2 MG/ML PO SYRP: 8.0000 mg | ORAL_SOLUTION | Freq: Once | ORAL | Status: DC | PRN

## 2020-06-28 MED ORDER — DIPHENHYDRAMINE HCL 50 MG/ML IJ SOLN: 50.0000 mg | Freq: Once | INTRAMUSCULAR | Status: DC | PRN

## 2020-06-28 MED ORDER — ATROPINE SULFATE 1 MG/10ML IJ SOSY
PREFILLED_SYRINGE | INTRAMUSCULAR | Status: AC
  Filled 2020-06-28: qty 20

## 2020-06-28 MED ORDER — MORPHINE SULFATE (PF) 4 MG/ML IV SOLN: 2.0000 mg | INTRAVENOUS | Status: DC | PRN

## 2020-06-28 MED ORDER — DOPAMINE-DEXTROSE 3.2-5 MG/ML-% IV SOLN
INTRAVENOUS | Status: AC
  Filled 2020-06-28: qty 250

## 2020-06-28 MED ORDER — SODIUM CHLORIDE 0.9% FLUSH: 3.0000 mL | Freq: Two times a day (BID) | INTRAVENOUS | Status: DC

## 2020-06-28 MED ORDER — MIDAZOLAM HCL 5 MG/5ML IJ SOLN
INTRAMUSCULAR | Status: AC
  Filled 2020-06-28: qty 5

## 2020-06-28 MED ORDER — ACETAMINOPHEN 325 MG PO TABS: 650.0000 mg | ORAL_TABLET | ORAL | Status: DC | PRN

## 2020-06-28 MED ORDER — ONDANSETRON HCL 4 MG/2ML IJ SOLN: 4.0000 mg | Freq: Four times a day (QID) | INTRAMUSCULAR | Status: DC | PRN

## 2020-06-28 SURGICAL SUPPLY — 19 items
CATH ANGIO 5F PIGTAIL 100CM (CATHETERS) ×2 IMPLANT
CATH HEADHUNTER H1 5F 100CM (CATHETERS) ×2 IMPLANT
COVER PROBE U/S 5X48 (MISCELLANEOUS) ×2 IMPLANT
DEVICE EMBOSHIELD NAV6 4.0-7.0 (FILTER) IMPLANT
DEVICE STARCLOSE SE CLOSURE (Vascular Products) ×2 IMPLANT
DEVICE TORQUE (MISCELLANEOUS) ×2 IMPLANT
GLIDEWIRE ADV .035X260CM (WIRE) ×2 IMPLANT
GLIDEWIRE ANGLED SS 035X260CM (WIRE) ×2 IMPLANT
GOWN SRG XL LVL 3 NONREINFORCE (GOWNS) ×2 IMPLANT
GOWN STRL NON-REIN TWL XL LVL3 (GOWNS) ×2
KIT CAROTID MANIFOLD (MISCELLANEOUS) ×2 IMPLANT
KIT ENCORE 26 ADVANTAGE (KITS) IMPLANT
NEEDLE ENTRY 21GA 7CM ECHOTIP (NEEDLE) ×2 IMPLANT
PACK ANGIOGRAPHY (CUSTOM PROCEDURE TRAY) ×2 IMPLANT
SHEATH BRITE TIP 5FRX11 (SHEATH) ×4 IMPLANT
SHEATH BRITE TIP 6FRX11 (SHEATH) ×2 IMPLANT
SHEATH SHUTTLE 6FR (SHEATH) ×2 IMPLANT
WIRE G VAS 035X260 STIFF (WIRE) ×2 IMPLANT
WIRE GUIDERIGHT .035X150 (WIRE) ×2 IMPLANT

## 2020-06-28 NOTE — Interval H&P Note (Signed)
History and Physical Interval Note:  06/28/2020 10:30 AM  Scott Parker  has presented today for surgery, with the diagnosis of LT Carotid Stent Placement   [place]   Carotid artery stenosis Covid March 28.  The various methods of treatment have been discussed with the patient and family. After consideration of risks, benefits and other options for treatment, the patient has consented to  Procedure(s): CAROTID PTA/STENT INTERVENTION (Left) as a surgical intervention.  The patient's history has been reviewed, patient examined, no change in status, stable for surgery.  I have reviewed the patient's chart and labs.  Questions were answered to the patient's satisfaction.     Levora Dredge

## 2020-06-28 NOTE — Op Note (Signed)
[facility]   OPERATIVE NOTE  DATE: 06/28/2020  PRE-OPERATIVE DIAGNOSIS:  1.  Greater than 80% bilateral carotid artery stenosis 2.  Syncope associated aphasia  POST-OPERATIVE DIAGNOSIS:  1.  Approximately 50% bilateral carotid artery stenosis. 2.  Syncope associated with aphasia  PROCEDURE: 1.   Ultrasound Guidance for vascular access right femoral artery 2.   Catheter placement into right common carotid artery and into left common carotid artery from right femoral approach 3.   Thoracic aortogram 4.   Cervical and cerebral bilateral carotid angiograms 5.   StarClose closure device right femoral artery  SURGEON: Levora Dredge, MD  ASSISTANT(S): Annice Needy, MD  ANESTHESIA: Moderate conscious sedation  ESTIMATED BLOOD LOSS: 20 cc  FLUORO TIME: 5.5 minutes  CONTRAST: 70 cc  MODERATE CONSCIOUS SEDATION TIME: 46 minutes of conscious sedation was utilized.  Continuous ECG pulse oximetry and cardiopulmonary monitoring is performed throughout the entire procedure by the interventional radiology nurse total sedation time as documented above.  Parenteral Versed and fentanyl were administered by the radiology nurse.  FINDING(S): 1.  Calcified bilateral internal carotid artery stenosis.  Maximal degree of stenosis identified in multiple views was 50%  SPECIMEN(S):  None  INDICATIONS:   Patient is a 75 y.o.male who presents with global symptoms that included aphasia.  CT angio at an outside institution demonstrates bilateral greater than 80% stenosis.  Catheter-based angiogram is performed for further evaluation. Risks and benefits are discussed and informed consent was obtained.  DESCRIPTION: After obtaining full informed written consent, the patient was brought back to the operating room and placed supine upon the vascular suite table.  After obtaining adequate anesthesia, the patient was prepped and draped in the standard fashion.  Moderate conscious  sedation was administered during a face to face encounter with the patient throughout the procedure with my supervision of the RN administering medicines and monitoring the patients vital signs and mental status throughout from the start of the procedure until the patient was taken to the recovery room. The right femoral artery was visualized with ultrasound and found to be calcific but patent. It was then accessed under direct ultrasound guidance without difficulty with a Seldinger needle. A J-wire and 5 French sheath were placed and a permanent image was recorded. The patient was given 3000 units of intravenous heparin. A pigtail catheter was placed into the ascending aorta and an LAO projection thoracic aortogram was performed. This showed type II aortic arch no evidence of ostial stenosis of the great vessels.  I then selected a H1 catheter and selectively cannulated the left common carotid artery without difficulty and advanced into the left common carotid artery. Selective imaging was then performed of the cervical and cerebral carotid artery on the left. Intracranial filling was intact no evidence of hemodynamically significant stenoses.  There is filling of the middle and anterior cerebral arteries as well as cross-filling of the right anterior cerebral artery. The cervical carotid artery was approximately 50%. Multiple views were taken in the cervical carotid artery bilaterally.  Next the catheter was repositioned and the H1 catheter advanced into the innominate artery and then the mid right common carotid artery. Cervical and cerebral carotid angiography were then performed on the right side initially. The intracranial flow was found to be intact with filling of the middle and anterior cerebral arteries.  The cervical carotid artery was approximately 50%.   At this point, we had imaging to plan our treatment and we elected to terminate the procedure.  The diagnostic catheter was removed. Oblique  arteriogram was performed of the right femoral artery and StarClose closure device was deployed in usual fashion with excellent hemostatic result. The patient tolerated the procedure well and was taken to the recovery room in stable condition.  COMPLICATIONS: None  CONDITION: Stable   Levora Dredge 06/28/2020 11:58 AM   This note was created with Dragon Medical transcription system. Any errors in dictation are purely unintentional.

## 2020-06-28 NOTE — Progress Notes (Signed)
Dr. Gilda Crease at bedside. Patient with new dressing applied. Per MD patient to lay flat for 15 minutes.

## 2020-06-29 ENCOUNTER — Encounter: Payer: Self-pay | Admitting: Vascular Surgery

## 2020-06-29 LAB — POCT ACTIVATED CLOTTING TIME: Activated Clotting Time: 273 seconds

## 2020-06-29 NOTE — Discharge Summary (Signed)
Patient did not require carotid artery stenting therefore he was not admitted.  He underwent a diagnostic procedure and was discharged the same day patient.  There is no indication for discharge summary

## 2020-06-30 DIAGNOSIS — I6529 Occlusion and stenosis of unspecified carotid artery: Secondary | ICD-10-CM | POA: Diagnosis present

## 2020-07-20 ENCOUNTER — Encounter (INDEPENDENT_AMBULATORY_CARE_PROVIDER_SITE_OTHER): Payer: Self-pay | Admitting: Vascular Surgery

## 2020-07-20 ENCOUNTER — Ambulatory Visit (INDEPENDENT_AMBULATORY_CARE_PROVIDER_SITE_OTHER): Payer: Medicare Other | Admitting: Vascular Surgery

## 2020-07-20 ENCOUNTER — Other Ambulatory Visit: Payer: Self-pay

## 2020-07-20 VITALS — BP 138/78 | HR 69 | Ht 70.0 in | Wt 181.0 lb

## 2020-07-20 DIAGNOSIS — I6523 Occlusion and stenosis of bilateral carotid arteries: Secondary | ICD-10-CM

## 2020-07-20 DIAGNOSIS — I10 Essential (primary) hypertension: Secondary | ICD-10-CM

## 2020-07-20 DIAGNOSIS — I25118 Atherosclerotic heart disease of native coronary artery with other forms of angina pectoris: Secondary | ICD-10-CM

## 2020-07-20 DIAGNOSIS — K219 Gastro-esophageal reflux disease without esophagitis: Secondary | ICD-10-CM

## 2020-07-24 ENCOUNTER — Encounter (INDEPENDENT_AMBULATORY_CARE_PROVIDER_SITE_OTHER): Payer: Self-pay | Admitting: Vascular Surgery

## 2020-07-24 NOTE — Progress Notes (Signed)
Patient ID: Scott Parker, male   DOB: 28-Jun-1945, 75 y.o.   MRN: [id number]  Chief Complaint  Patient presents with  . Follow-up  . Carotid    Post carotid pta/ stent F/U     HPI Scott Parker is a 75 y.o. male.    The patient is seen for follow up evaluation of carotid stenosis status post bilateral carotid angiography.  There were no post operative problems or complications related to the angiogram.  The patient denies neck or incisional pain.  The patient denies interval amaurosis fugax. There is no recent history of TIA symptoms or focal motor deficits. There is no prior documented CVA.  The patient denies headache.  The patient is taking enteric-coated aspirin 81 mg daily.  The patient has a history of coronary artery disease, no recent episodes of angina or shortness of breath. The patient denies PAD or claudication symptoms. There is a history of hyperlipidemia which is being treated with a statin.    Past Medical History:  Diagnosis Date  . Arthritis    spinal column  . COPD (chronic obstructive pulmonary disease) (HCC)   . Coronary artery disease   . Dyspnea    with exertion  . Dysrhythmia   . GERD (gastroesophageal reflux disease)   . Gout    right foot  . HOH (hard of hearing)   . Hypercholesterolemia   . Hyperlipemia   . Hypertension   . MRSA (methicillin resistant Staphylococcus aureus) 2016   HX of right lower leg  . Wears dentures    full upper, partial lower    Past Surgical History:  Procedure Laterality Date  . CARDIAC CATHETERIZATION  12/2019  . CAROTID PTA/STENT INTERVENTION Left 06/28/2020   Procedure: CAROTID PTA/STENT INTERVENTION;  Surgeon: Renford Dills, MD;  Location: [facility] INVASIVE CV LAB;  Service: Cardiovascular;  Laterality: Left;  . CATARACT EXTRACTION W/PHACO Left 02/22/2020   Procedure: CATARACT EXTRACTION PHACO AND INTRAOCULAR LENS PLACEMENT (IOC) LEFT 4.45 00:41.2;  Surgeon: Galen Manila, MD;  Location: [facility];  Service: Ophthalmology;  Laterality: Left;  . CATARACT EXTRACTION W/PHACO Right 03/14/2020   Procedure: CATARACT EXTRACTION PHACO AND INTRAOCULAR LENS PLACEMENT (IOC) RIGHT 6.06 00:50.4;  Surgeon: Galen Manila, MD;  Location: [facility];  Service: Ophthalmology;  Laterality: Right;  . COLONOSCOPY     pt states he has had x 2  . fusion     cervical 3-7, repair ruptured disc  . JOINT REPLACEMENT Bilateral    bilateral hip replacements  . LEFT HEART CATH N/A 01/06/2020   Procedure: Left Heart Cath with possible intervention;  Surgeon: Laurier Nancy, MD;  Location: [facility];  Service: Cardiovascular;  Laterality: N/A;  . TOTAL HIP ARTHROPLASTY     bilateral      Allergies  Allergen Reactions  . Codeine Other (See Comments)    Reports has taken without problems    Current Outpatient Medications  Medication Sig Dispense Refill  . acetaminophen (TYLENOL) 500 MG tablet Take 1,000 mg by mouth every 6 (six) hours as needed for mild pain or moderate pain.    Marland Kitchen albuterol (VENTOLIN HFA) 108 (90 Base) MCG/ACT inhaler Inhale into the lungs every 6 (six) hours as needed for wheezing or shortness of breath.    Marland Kitchen aspirin EC 81 MG tablet Take 81 mg by mouth daily. Swallow whole.    . budesonide-formoterol (SYMBICORT) 160-4.5 MCG/ACT inhaler Inhale 1 puff into the lungs daily.    Marland Kitchen  Calcium Citrate (CITRACAL PO) Take 1 tablet by mouth daily.    . Cinnamon 500 MG capsule Take 1,000 mg by mouth daily.    . clopidogrel (PLAVIX) 75 MG tablet Take 1 tablet (75 mg total) by mouth daily. 30 tablet 11  . GARLIC PO Take 1 tablet by mouth daily.    . hydrochlorothiazide (HYDRODIURIL) 25 MG tablet Take 25 mg by mouth daily.    . isosorbide mononitrate (IMDUR) 30 MG 24 hr tablet Take 30 mg by mouth at bedtime.    . metoprolol tartrate (LOPRESSOR) 50 MG tablet Take 50 mg by mouth 2 (two) times daily.    Marland Kitchen omega-3 acid ethyl esters (LOVAZA) 1 g capsule Take 1 g by mouth  daily.    . pantoprazole (PROTONIX) 40 MG tablet Take 1 tablet (40 mg total) by mouth daily. 30 tablet 11  . rosuvastatin (CRESTOR) 40 MG tablet Take 40 mg by mouth at bedtime.    Marland Kitchen zinc gluconate 50 MG tablet Take 50 mg by mouth in the morning and at bedtime.     No current facility-administered medications for this visit.   Facility-Administered Medications Ordered in Other Visits  Medication Dose Route Frequency Provider Last Rate Last Admin  . sodium chloride flush (NS) 0.9 % injection 3 mL  3 mL Intravenous Q12H Laurier Nancy, MD            Physical Exam BP 138/78   Pulse 69   Ht 5\' 10"  (1.778 m)   Wt 181 lb (82.1 kg)   BMI 25.97 kg/m  Gen:  WD/WN, NAD Skin: incision C/D/I neuro intact     Assessment/Plan: 1. 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  Angiogram shows 50-60% stenosis bilaterally.  Continue antiplatelet therapy as prescribed Continue management of CAD, HTN and Hyperlipidemia Healthy heart diet,  encouraged exercise at least 4 times per week Follow up in 3 months with duplex ultrasound and physical exam   2. Coronary artery disease of native artery of native heart with stable angina pectoris (HCC) Continue cardiac and antihypertensive medications as already ordered and reviewed, no changes at this time.  Continue statin as ordered and reviewed, no changes at this time  Nitrates PRN for chest pain   3. Essential hypertension Continue antihypertensive medications as already ordered, these medications have been reviewed and there are no changes at this time.   4. Gastroesophageal reflux disease without esophagitis Continue PPI as already ordered, this medication has been reviewed and there are no changes at this time.  Avoidence of caffeine and alcohol  Moderate elevation of the head of the bed        07/24/2020, 9:16 AM   This note was created with Dragon medical transcription system.  Any errors from dictation are unintentional.

## 2020-10-17 ENCOUNTER — Other Ambulatory Visit (INDEPENDENT_AMBULATORY_CARE_PROVIDER_SITE_OTHER): Payer: Self-pay | Admitting: Vascular Surgery

## 2020-10-17 DIAGNOSIS — I6529 Occlusion and stenosis of unspecified carotid artery: Secondary | ICD-10-CM

## 2020-10-19 ENCOUNTER — Ambulatory Visit (INDEPENDENT_AMBULATORY_CARE_PROVIDER_SITE_OTHER): Payer: Medicare Other | Admitting: Vascular Surgery

## 2020-10-19 ENCOUNTER — Encounter (INDEPENDENT_AMBULATORY_CARE_PROVIDER_SITE_OTHER): Payer: Medicare Other

## 2020-10-26 ENCOUNTER — Ambulatory Visit: Payer: Self-pay | Admitting: Cardiovascular Disease

## 2020-10-26 DIAGNOSIS — I200 Unstable angina: Secondary | ICD-10-CM | POA: Insufficient documentation

## 2020-10-26 MED ORDER — SODIUM CHLORIDE 0.9% FLUSH: 3.0000 mL | Freq: Two times a day (BID) | INTRAVENOUS | Status: DC

## 2020-10-30 ENCOUNTER — Encounter (INDEPENDENT_AMBULATORY_CARE_PROVIDER_SITE_OTHER): Payer: Medicare Other

## 2020-10-30 ENCOUNTER — Ambulatory Visit (INDEPENDENT_AMBULATORY_CARE_PROVIDER_SITE_OTHER): Payer: Medicare Other | Admitting: Vascular Surgery

## 2020-10-31 DIAGNOSIS — I200 Unstable angina: Secondary | ICD-10-CM | POA: Insufficient documentation

## 2020-11-03 ENCOUNTER — Encounter
Admission: RE | Disposition: A | Discharge status: Home / Self Care | Payer: Self-pay | Source: Home / Self Care | Attending: Internal Medicine

## 2020-11-03 ENCOUNTER — Other Ambulatory Visit: Payer: Self-pay

## 2020-11-03 ENCOUNTER — Encounter: Payer: Self-pay | Admitting: Cardiovascular Disease

## 2020-11-03 ENCOUNTER — Observation Stay
Admission: RE | Admit: 2020-11-03 | Discharge: 2020-11-04 | Disposition: A | Discharge status: Home / Self Care | Payer: Medicare Other | Attending: Internal Medicine | Admitting: Internal Medicine

## 2020-11-03 DIAGNOSIS — K219 Gastro-esophageal reflux disease without esophagitis: Secondary | ICD-10-CM | POA: Diagnosis present

## 2020-11-03 DIAGNOSIS — I25110 Atherosclerotic heart disease of native coronary artery with unstable angina pectoris: Secondary | ICD-10-CM | POA: Diagnosis not present

## 2020-11-03 DIAGNOSIS — I25118 Atherosclerotic heart disease of native coronary artery with other forms of angina pectoris: Secondary | ICD-10-CM

## 2020-11-03 DIAGNOSIS — I2510 Atherosclerotic heart disease of native coronary artery without angina pectoris: Secondary | ICD-10-CM | POA: Insufficient documentation

## 2020-11-03 DIAGNOSIS — Z87891 Personal history of nicotine dependence: Secondary | ICD-10-CM | POA: Diagnosis not present

## 2020-11-03 DIAGNOSIS — R079 Chest pain, unspecified: Secondary | ICD-10-CM | POA: Insufficient documentation

## 2020-11-03 DIAGNOSIS — Z7902 Long term (current) use of antithrombotics/antiplatelets: Secondary | ICD-10-CM | POA: Insufficient documentation

## 2020-11-03 DIAGNOSIS — Z7982 Long term (current) use of aspirin: Secondary | ICD-10-CM | POA: Diagnosis not present

## 2020-11-03 DIAGNOSIS — I10 Essential (primary) hypertension: Secondary | ICD-10-CM | POA: Insufficient documentation

## 2020-11-03 DIAGNOSIS — J449 Chronic obstructive pulmonary disease, unspecified: Secondary | ICD-10-CM | POA: Insufficient documentation

## 2020-11-03 DIAGNOSIS — I200 Unstable angina: Secondary | ICD-10-CM | POA: Diagnosis not present

## 2020-11-03 DIAGNOSIS — I208 Other forms of angina pectoris: Secondary | ICD-10-CM | POA: Insufficient documentation

## 2020-11-03 DIAGNOSIS — Z96643 Presence of artificial hip joint, bilateral: Secondary | ICD-10-CM | POA: Diagnosis not present

## 2020-11-03 HISTORY — PX: LEFT HEART CATH AND CORONARY ANGIOGRAPHY: CATH118249

## 2020-11-03 HISTORY — PX: CORONARY STENT INTERVENTION: CATH118234

## 2020-11-03 LAB — CREATININE, SERUM
Creatinine, Ser: 0.99 mg/dL (ref 0.61–1.24)
GFR, Estimated: >60 mL/min (ref >60)

## 2020-11-03 LAB — POCT ACTIVATED CLOTTING TIME: Activated Clotting Time: 578 seconds

## 2020-11-03 SURGERY — LEFT HEART CATH AND CORONARY ANGIOGRAPHY
Anesthesia: Moderate Sedation | Laterality: Right

## 2020-11-03 MED ORDER — ACETAMINOPHEN 325 MG PO TABS
650.0000 mg | ORAL_TABLET | Freq: Two times a day (BID) | ORAL | Status: DC
  Administered 2020-11-03 – 2020-11-04 (×2): 650 mg via ORAL
  Filled 2020-11-03 (×2): qty 2

## 2020-11-03 MED ORDER — IOHEXOL 300 MG/ML  SOLN
INTRAMUSCULAR | Status: DC | PRN
  Administered 2020-11-03: 78 mL

## 2020-11-03 MED ORDER — ATROPINE SULFATE 1 MG/10ML IJ SOSY
PREFILLED_SYRINGE | INTRAMUSCULAR | Status: AC
  Filled 2020-11-03: qty 10

## 2020-11-03 MED ORDER — FAMOTIDINE IN NACL 20-0.9 MG/50ML-% IV SOLN
20.0000 mg | Freq: Once | INTRAVENOUS | Status: AC
  Administered 2020-11-04: 20 mg via INTRAVENOUS
  Filled 2020-11-03 (×2): qty 50

## 2020-11-03 MED ORDER — METOPROLOL TARTRATE 50 MG PO TABS
50.0000 mg | ORAL_TABLET | Freq: Two times a day (BID) | ORAL | Status: DC
  Administered 2020-11-03 – 2020-11-04 (×3): 50 mg via ORAL
  Filled 2020-11-03 (×3): qty 1

## 2020-11-03 MED ORDER — ASPIRIN 81 MG PO CHEW: 81.0000 mg | CHEWABLE_TABLET | ORAL | Status: DC

## 2020-11-03 MED ORDER — FAMOTIDINE IN NACL 20-0.9 MG/50ML-% IV SOLN
INTRAVENOUS | Status: AC | PRN
  Administered 2020-11-03: 20 mg via INTRAVENOUS

## 2020-11-03 MED ORDER — ENOXAPARIN SODIUM 40 MG/0.4ML IJ SOSY
40.0000 mg | PREFILLED_SYRINGE | INTRAMUSCULAR | Status: DC
  Administered 2020-11-03: 40 mg via SUBCUTANEOUS
  Filled 2020-11-03: qty 0.4

## 2020-11-03 MED ORDER — RANOLAZINE ER 500 MG PO TB12
500.0000 mg | ORAL_TABLET | Freq: Two times a day (BID) | ORAL | Status: DC
  Administered 2020-11-03 – 2020-11-04 (×3): 500 mg via ORAL
  Filled 2020-11-03 (×4): qty 1

## 2020-11-03 MED ORDER — ATORVASTATIN CALCIUM 80 MG PO TABS
80.0000 mg | ORAL_TABLET | Freq: Every evening | ORAL | Status: DC
  Administered 2020-11-03: 80 mg via ORAL
  Filled 2020-11-03: qty 1

## 2020-11-03 MED ORDER — ONDANSETRON HCL 4 MG/2ML IJ SOLN: 4.0000 mg | Freq: Four times a day (QID) | INTRAMUSCULAR | Status: DC | PRN

## 2020-11-03 MED ORDER — FLUTICASONE FUROATE-VILANTEROL 200-25 MCG/INH IN AEPB
1.0000 | INHALATION_SPRAY | Freq: Every day | RESPIRATORY_TRACT | Status: DC
  Administered 2020-11-04: 1 via RESPIRATORY_TRACT
  Filled 2020-11-03: qty 28

## 2020-11-03 MED ORDER — FENTANYL CITRATE (PF) 100 MCG/2ML IJ SOLN
INTRAMUSCULAR | Status: DC | PRN
  Administered 2020-11-03: 25 ug via INTRAVENOUS

## 2020-11-03 MED ORDER — SODIUM CHLORIDE 0.9 % WEIGHT BASED INFUSION
3.0000 mL/kg/h | INTRAVENOUS | Status: DC
  Administered 2020-11-03: 3 mL/kg/h via INTRAVENOUS

## 2020-11-03 MED ORDER — HYDROCHLOROTHIAZIDE 25 MG PO TABS
25.0000 mg | ORAL_TABLET | Freq: Every day | ORAL | Status: DC
  Administered 2020-11-03 – 2020-11-04 (×2): 25 mg via ORAL
  Filled 2020-11-03 (×2): qty 1

## 2020-11-03 MED ORDER — SODIUM CHLORIDE 0.9% FLUSH: 3.0000 mL | INTRAVENOUS | Status: DC | PRN

## 2020-11-03 MED ORDER — IOHEXOL 300 MG/ML  SOLN
INTRAMUSCULAR | Status: DC | PRN
  Administered 2020-11-03: 72 mL via INTRA_ARTERIAL

## 2020-11-03 MED ORDER — SODIUM CHLORIDE 0.9 % WEIGHT BASED INFUSION: 1.0000 mL/kg/h | INTRAVENOUS | Status: DC

## 2020-11-03 MED ORDER — BIVALIRUDIN TRIFLUOROACETATE 250 MG IV SOLR
INTRAVENOUS | Status: AC
  Filled 2020-11-03: qty 250

## 2020-11-03 MED ORDER — HEPARIN (PORCINE) IN NACL 2000-0.9 UNIT/L-% IV SOLN
INTRAVENOUS | Status: DC | PRN
  Administered 2020-11-03: 1000 mL

## 2020-11-03 MED ORDER — ALBUTEROL SULFATE (2.5 MG/3ML) 0.083% IN NEBU: 3.0000 mL | INHALATION_SOLUTION | Freq: Four times a day (QID) | RESPIRATORY_TRACT | Status: DC | PRN

## 2020-11-03 MED ORDER — LIDOCAINE HCL (PF) 1 % IJ SOLN
INTRAMUSCULAR | Status: AC
  Filled 2020-11-03: qty 30

## 2020-11-03 MED ORDER — ACETAMINOPHEN 325 MG PO TABS: 650.0000 mg | ORAL_TABLET | ORAL | Status: DC | PRN

## 2020-11-03 MED ORDER — ASPIRIN EC 81 MG PO TBEC
81.0000 mg | DELAYED_RELEASE_TABLET | Freq: Every day | ORAL | Status: DC
  Administered 2020-11-04: 81 mg via ORAL
  Filled 2020-11-03: qty 1

## 2020-11-03 MED ORDER — SODIUM CHLORIDE 0.9 % IV SOLN
INTRAVENOUS | Status: AC | PRN
  Administered 2020-11-03: 1.75 mg/kg/h via INTRAVENOUS

## 2020-11-03 MED ORDER — HEPARIN (PORCINE) IN NACL 1000-0.9 UT/500ML-% IV SOLN
INTRAVENOUS | Status: AC
  Filled 2020-11-03: qty 1000

## 2020-11-03 MED ORDER — CLOPIDOGREL BISULFATE 75 MG PO TABS
ORAL_TABLET | ORAL | Status: DC | PRN
  Administered 2020-11-03: 600 mg via ORAL

## 2020-11-03 MED ORDER — PANTOPRAZOLE SODIUM 40 MG PO TBEC
40.0000 mg | DELAYED_RELEASE_TABLET | Freq: Every day | ORAL | Status: DC
  Administered 2020-11-04: 40 mg via ORAL
  Filled 2020-11-03: qty 1

## 2020-11-03 MED ORDER — CLOPIDOGREL BISULFATE 75 MG PO TABS
ORAL_TABLET | ORAL | Status: AC
  Filled 2020-11-03: qty 4

## 2020-11-03 MED ORDER — BIVALIRUDIN BOLUS VIA INFUSION - CUPID
INTRAVENOUS | Status: DC | PRN
  Administered 2020-11-03: 60.9 mg via INTRAVENOUS

## 2020-11-03 MED ORDER — MIDAZOLAM HCL 2 MG/2ML IJ SOLN
INTRAMUSCULAR | Status: DC | PRN
  Administered 2020-11-03: 1 mg via INTRAVENOUS

## 2020-11-03 MED ORDER — CLOPIDOGREL BISULFATE 75 MG PO TABS
75.0000 mg | ORAL_TABLET | Freq: Every day | ORAL | Status: DC
  Administered 2020-11-04: 75 mg via ORAL
  Filled 2020-11-03: qty 1

## 2020-11-03 MED ORDER — ISOSORBIDE MONONITRATE ER 60 MG PO TB24
60.0000 mg | ORAL_TABLET | Freq: Every evening | ORAL | Status: DC
  Administered 2020-11-03: 60 mg via ORAL
  Filled 2020-11-03: qty 1

## 2020-11-03 MED ORDER — FENTANYL CITRATE (PF) 100 MCG/2ML IJ SOLN
INTRAMUSCULAR | Status: AC
  Filled 2020-11-03: qty 2

## 2020-11-03 MED ORDER — CALCIUM CARBONATE ANTACID 500 MG PO CHEW: 500.0000 mg | CHEWABLE_TABLET | Freq: Every day | ORAL | Status: DC | PRN

## 2020-11-03 MED ORDER — SODIUM CHLORIDE 0.9 % WEIGHT BASED INFUSION
1.0000 mL/kg/h | INTRAVENOUS | Status: AC
  Administered 2020-11-03: 1 mL/kg/h via INTRAVENOUS

## 2020-11-03 MED ORDER — OMEGA-3-ACID ETHYL ESTERS 1 G PO CAPS
1000.0000 mg | ORAL_CAPSULE | Freq: Every day | ORAL | Status: DC
  Administered 2020-11-03 – 2020-11-04 (×2): 1000 mg via ORAL
  Filled 2020-11-03 (×2): qty 1

## 2020-11-03 MED ORDER — SODIUM CHLORIDE 0.9 % IV SOLN: 250.0000 mL | INTRAVENOUS | Status: DC | PRN

## 2020-11-03 MED ORDER — MIDAZOLAM HCL 2 MG/2ML IJ SOLN
INTRAMUSCULAR | Status: AC
  Filled 2020-11-03: qty 2

## 2020-11-03 MED ORDER — SODIUM CHLORIDE 0.9% FLUSH
3.0000 mL | Freq: Two times a day (BID) | INTRAVENOUS | Status: DC
  Administered 2020-11-03 – 2020-11-04 (×2): 3 mL via INTRAVENOUS

## 2020-11-03 SURGICAL SUPPLY — 21 items
BALLN TREK RX 3.0X15 (BALLOONS) ×3
BALLOON TREK RX 3.0X15 (BALLOONS) ×2 IMPLANT
CATH INFINITI 5FR ANG PIGTAIL (CATHETERS) ×3 IMPLANT
CATH INFINITI 5FR JL4 (CATHETERS) ×3 IMPLANT
CATH INFINITI JR4 5F (CATHETERS) ×3 IMPLANT
CATH LAUNCHER 6FR JR4 (CATHETERS) ×3 IMPLANT
DEVICE CLOSURE MYNXGRIP 6/7F (Vascular Products) ×3 IMPLANT
KIT ENCORE 26 ADVANTAGE (KITS) ×3 IMPLANT
KIT SYRINGE INJ CVI SPIKEX1 (MISCELLANEOUS) ×3 IMPLANT
NEEDLE PERC 18GX7CM (NEEDLE) ×3 IMPLANT
PACK CARDIAC CATH (CUSTOM PROCEDURE TRAY) ×3 IMPLANT
PAD ONESTEP ZOLL R SERIES ADT (MISCELLANEOUS) ×3 IMPLANT
PROTECTION STATION PRESSURIZED (MISCELLANEOUS) ×3
SET ATX SIMPLICITY (MISCELLANEOUS) ×3 IMPLANT
SHEATH AVANTI 5FR X 11CM (SHEATH) ×3 IMPLANT
SHEATH AVANTI 6FR X 11CM (SHEATH) ×3 IMPLANT
STATION PROTECTION PRESSURIZED (MISCELLANEOUS) ×2 IMPLANT
STENT ONYX FRONTIER 4.0X15 (Permanent Stent) ×3 IMPLANT
TUBING CIL FLEX 10 FLL-RA (TUBING) ×3 IMPLANT
WIRE GUIDERIGHT .035X150 (WIRE) ×3 IMPLANT
WIRE RUNTHROUGH .014X180CM (WIRE) ×3 IMPLANT

## 2020-11-03 NOTE — Progress Notes (Signed)
Pt alert and oriented x 4. Femoral site clean dry and intact. No hematoma. No complaints of pain. Resting comfortabley. All needs met a this time.

## 2020-11-03 NOTE — H&P (Signed)
History and Physical    Scott Parker IPJ:[id number] DOB: 1945/09/25 DOA: 11/03/2020  PCP: System, Provider Not In   Patient coming from: Home  I have personally briefly reviewed patient's old medical records in [facility]  Chief Complaint: Chest pain  HPI: Scott Parker is a 75 y.o. male with medical history significant for COPD, coronary artery disease, hypertension, GERD and dyslipidemia who was seen by his cardiologist in the outpatient setting for evaluation of chest pain.  He had a nuclear stress test and was found to have inferior ischemia.  CTA coronaries was done which showed an ostial lesion in the RCA with recommendation for cardiac catheterization. Patient is status post cardiac cath which showed 99% ostial lesion in the right coronary and 70% in the left circumflex obtuse marginal. Patient is status post successful angioplasty and drug-eluting stent placement to the ostial right coronary artery. He complains of heartburn but denies having any shortness of breath, no nausea, no vomiting, no diaphoresis, no palpitations, no dizziness, no lightheadedness, no fever, no chills, no cough, no changes in his bowel habits or urinary symptoms.   Review of Systems: As per HPI otherwise all other systems reviewed and negative.    Past Medical History:  Diagnosis Date   Arthritis    spinal column   COPD (chronic obstructive pulmonary disease) (HCC)    Coronary artery disease    Dyspnea    with exertion   Dysrhythmia    GERD (gastroesophageal reflux disease)    Gout    right foot   HOH (hard of hearing)    Hypercholesterolemia    Hyperlipemia    Hypertension    MRSA (methicillin resistant Staphylococcus aureus) 2016   HX of right lower leg   Wears dentures    full upper, partial lower    Past Surgical History:  Procedure Laterality Date   CARDIAC CATHETERIZATION  12/2019   CAROTID PTA/STENT INTERVENTION Left 06/28/2020   Procedure: CAROTID PTA/STENT  INTERVENTION;  Surgeon: Renford Dills, MD;  Location: [facility];  Service: Cardiovascular;  Laterality: Left;   CATARACT EXTRACTION W/PHACO Left 02/22/2020   Procedure: CATARACT EXTRACTION PHACO AND INTRAOCULAR LENS PLACEMENT (IOC) LEFT 4.45 00:41.2;  Surgeon: Galen Manila, MD;  Location: [facility];  Service: Ophthalmology;  Laterality: Left;   CATARACT EXTRACTION W/PHACO Right 03/14/2020   Procedure: CATARACT EXTRACTION PHACO AND INTRAOCULAR LENS PLACEMENT (IOC) RIGHT 6.06 00:50.4;  Surgeon: Galen Manila, MD;  Location: [facility];  Service: Ophthalmology;  Laterality: Right;   COLONOSCOPY     pt states he has had x 2   fusion     cervical 3-7, repair ruptured disc   JOINT REPLACEMENT Bilateral    bilateral hip replacements   LEFT HEART CATH N/A 01/06/2020   Procedure: Left Heart Cath with possible intervention;  Surgeon: Laurier Nancy, MD;  Location: [facility];  Service: Cardiovascular;  Laterality: N/A;   TOTAL HIP ARTHROPLASTY     bilateral     reports that he quit smoking about 25 years ago. His smoking use included cigarettes. He has never used smokeless tobacco. He reports previous alcohol use. He reports that he does not use drugs.  No Known Allergies  Family History  Problem Relation Age of Onset   Hypertension Mother       Prior to Admission medications   Medication Sig Start Date End Date Taking? Authorizing Provider  acetaminophen (TYLENOL) 325 MG tablet Take 650 mg by mouth  2 (two) times daily.   Yes [provider]  aspirin EC 81 MG tablet Take 81 mg by mouth daily. Swallow whole.   Yes [provider]  budesonide-formoterol (SYMBICORT) 160-4.5 MCG/ACT inhaler Inhale 1 puff into the lungs daily.   Yes [provider]  calcium carbonate (TUMS - DOSED IN MG ELEMENTAL CALCIUM) 500 MG chewable tablet Chew 500 mg by mouth daily as needed for indigestion or heartburn.   Yes [provider]  Calcium Citrate (CITRACAL PO) Take 1 tablet by mouth daily.   Yes [provider]  CINNAMON PO Take 1,000 mg by mouth daily.   Yes [provider]  clopidogrel (PLAVIX) 75 MG tablet Take 1 tablet (75 mg total) by mouth daily. 06/01/20  Yes Schnier, Latina CraverGregory G, MD  diclofenac Sodium (VOLTAREN) 1 % GEL Apply 1 application topically 4 (four) times daily as needed (pain).   Yes [provider]  GARLIC PO Take 1 tablet by mouth daily.   Yes [provider]  hydrochlorothiazide (HYDRODIURIL) 25 MG tablet Take 25 mg by mouth daily. 12/07/19  Yes [provider]  isosorbide mononitrate (IMDUR) 60 MG 24 hr tablet Take 60 mg by mouth every evening. 10/09/20  Yes [provider]  metoprolol tartrate (LOPRESSOR) 50 MG tablet Take 50 mg by mouth 2 (two) times daily. 12/30/19  Yes [provider]  Omega-3 Fatty Acids (FISH OIL) 1200 MG CAPS Take 1,200 mg by mouth daily.   Yes [provider]  pantoprazole (PROTONIX) 40 MG tablet Take 1 tablet (40 mg total) by mouth daily. 06/01/20  Yes Schnier, Latina CraverGregory G, MD  ranolazine (RANEXA) 500 MG 12 hr tablet Take 500 mg by mouth 2 (two) times daily.   Yes [provider]  rosuvastatin (CRESTOR) 40 MG tablet Take 40 mg by mouth at bedtime. 12/27/19  Yes [provider]  zinc gluconate 50 MG tablet Take 50 mg by mouth 2 (two) times daily.   Yes [provider]  albuterol (VENTOLIN HFA) 108 (90 Base) MCG/ACT inhaler Inhale 1-2 puffs into the lungs every 6 (six) hours as needed for wheezing or shortness of breath.    [provider]  atorvastatin (LIPITOR) 80 MG tablet Take 80 mg by mouth every evening.    [provider]    Physical Exam: Vitals:   11/03/20 0930 11/03/20 0945 11/03/20 1000 11/03/20 1030  BP: 126/79 127/69 (!) 143/74 137/73  Pulse: 60 61 (!) 56 61  Resp: 18 18 14 13   Temp:      TempSrc:      SpO2: 100% 99% 95% 95%  Weight:       Height:         Vitals:   11/03/20 0930 11/03/20 0945 11/03/20 1000 11/03/20 1030  BP: 126/79 127/69 (!) 143/74 137/73  Pulse: 60 61 (!) 56 61  Resp: 18 18 14 13   Temp:      TempSrc:      SpO2: 100% 99% 95% 95%  Weight:      Height:          Constitutional: Alert and oriented x 3 . Not in any apparent distress HEENT:      Head: Normocephalic and atraumatic.         Eyes: PERLA, EOMI, Conjunctivae are normal. Sclera is non-icteric.       Mouth/Throat: Mucous membranes are moist.       Neck: Supple with no signs of meningismus. Cardiovascular: Regular rate and rhythm.  No murmurs, gallops, or rubs. 2+ symmetrical distal pulses are present . No JVD. No LE edema Respiratory: Respiratory effort normal .Lungs sounds clear bilaterally. No wheezes, crackles, or rhonchi.  Gastrointestinal: Soft, non tender, and non distended with positive bowel sounds.  Genitourinary: No CVA tenderness. Musculoskeletal: Nontender with normal range of motion in all extremities. No cyanosis, or erythema of extremities. Neurologic:  Face is symmetric. Moving all extremities. No gross focal neurologic deficits . Skin: Skin is warm, dry.  No rash or ulcers Psychiatric: Mood and affect are normal    Labs on Admission: I have personally reviewed following labs and imaging studies  CBC: No results for input(s): WBC, NEUTROABS, HGB, HCT, MCV, PLT in the last 168 hours. Basic Metabolic Panel: No results for input(s): NA, K, CL, CO2, GLUCOSE, BUN, CREATININE, CALCIUM, MG, PHOS in the last 168 hours. GFR: CrCl cannot be calculated (Patient's most recent lab result is older than the maximum 21 days allowed.). Liver Function Tests: No results for input(s): AST, ALT, ALKPHOS, BILITOT, PROT, ALBUMIN in the last 168 hours. No results for input(s): LIPASE, AMYLASE in the last 168 hours. No results for input(s): AMMONIA in the last 168 hours. Coagulation Profile: No results for input(s): INR, PROTIME in the last  168 hours. Cardiac Enzymes: No results for input(s): CKTOTAL, CKMB, CKMBINDEX, TROPONINI in the last 168 hours. BNP (last 3 results) No results for input(s): PROBNP in the last 8760 hours. HbA1C: No results for input(s): HGBA1C in the last 72 hours. CBG: No results for input(s): GLUCAP in the last 168 hours. Lipid Profile: No results for input(s): CHOL, HDL, LDLCALC, TRIG, CHOLHDL, LDLDIRECT in the last 72 hours. Thyroid Function Tests: No results for input(s): TSH, T4TOTAL, FREET4, T3FREE, THYROIDAB in the last 72 hours. Anemia Panel: No results for input(s): VITAMINB12, FOLATE, FERRITIN, TIBC, IRON, RETICCTPCT in the last 72 hours. Urine analysis: No results found for: COLORURINE, APPEARANCEUR, LABSPEC, PHURINE, GLUCOSEU, HGBUR, BILIRUBINUR, KETONESUR, PROTEINUR, UROBILINOGEN, NITRITE, LEUKOCYTESUR  Radiological Exams on Admission: CARDIAC CATHETERIZATION  Result Date: 11/03/2020 Formatting of this result is different from the original.   1st Mrg lesion is 75% stenosed.   Ost RCA to Prox RCA lesion is 99% stenosed.   A drug-eluting stent was successfully placed using a STENT ONYX FRONTIER 4.0X15.   Post intervention, there is a 0% residual stenosis. Successful angioplasty and drug-eluting stent placement to the ostial right coronary artery. Recommendations: Dual antiplatelet therapy for at least 6 months. Aggressive treatment of risk factors. Treat residual coronary artery disease medically.  The diseased OM branch is small in diameter about 2 mm.   CARDIAC CATHETERIZATION  Result Date: 11/03/2020 Formatting of this result is different from the original.   1st Mrg lesion is 75% stenosed.   Ost RCA lesion is 99% stenosed.   The left ventricular systolic function is normal.   LV end diastolic pressure is normal.   The left ventricular ejection fraction is 55-65% by visual estimate.   There is no mitral valve regurgitation.     Assessment/Plan Principal Problem:   Unstable angina  (HCC) Active Problems:   CAD (coronary artery disease)   Essential hypertension   GERD (gastroesophageal reflux disease)     Coronary artery disease Patient with a history of unstable angina who was seen in the outpatient setting by his cardiologist and had a high risk heart scan. He also had a CTA coronary which showed an ostial lesion in the RCA and cardiac catheterization was recommended. Patient is status post  successful angioplasty and drug-eluting stent placement to the ostial right coronary artery. Continue dual antiplatelet therapy for 6 months Continue Ranexa, Imdur, Lopressor and high intensity statins Discussed with cardiologist who states that patient may be discharged home in a.m. to follow up with him as an outpatient.     Hypertension Continue HCTZ, Imdur and metoprolol    GERD Continue Protonix    DVT prophylaxis: Lovenox  Code Status: full code  Family Communication: Greater than 50% of time was spent discussing patient's condition and plan of care with him at the bedside.  All questions and concerns have been addressed. They verbalized understanding and agree with the plan. Disposition Plan: Back to previous home environment Consults called: Cardiology Status: Observation    Scott Blankley MD [facility]     11/03/2020, 11:38 AM

## 2020-11-03 NOTE — Consult Note (Signed)
Scott Parker is a 75 y.o. male  [id number]  Primary Cardiologist: Karigan Cloninger Reason for Consultation: chest pain  HPI: 75 year old white male with a past medical history of coronary artery disease hypertension hyperlipidemia presented to the office with chest pain and was found to have inferior ischemia on a nuclear stress test and on CTA coronaries ostial lesion in the RCA this was referred for cardiac catheterization.  Cardiac catheterization confirmed ostial 99% lesion in the right coronary and 70% OM1.  Left ventricular ejection fraction was normal.  Patient is undergoing angioplasty and will be observed overnight.   Review of Systems: No shortness of breath or chest pain at this time   Past Medical History:  Diagnosis Date   Arthritis    spinal column   COPD (chronic obstructive pulmonary disease) (HCC)    Coronary artery disease    Dyspnea    with exertion   Dysrhythmia    GERD (gastroesophageal reflux disease)    Gout    right foot   HOH (hard of hearing)    Hypercholesterolemia    Hyperlipemia    Hypertension    MRSA (methicillin resistant Staphylococcus aureus) 2016   HX of right lower leg   Wears dentures    full upper, partial lower    Medications Prior to Admission  Medication Sig Dispense Refill   acetaminophen (TYLENOL) 325 MG tablet Take 650 mg by mouth 2 (two) times daily.     aspirin EC 81 MG tablet Take 81 mg by mouth daily. Swallow whole.     budesonide-formoterol (SYMBICORT) 160-4.5 MCG/ACT inhaler Inhale 1 puff into the lungs daily.     calcium carbonate (TUMS - DOSED IN MG ELEMENTAL CALCIUM) 500 MG chewable tablet Chew 500 mg by mouth daily as needed for indigestion or heartburn.     Calcium Citrate (CITRACAL PO) Take 1 tablet by mouth daily.     CINNAMON PO Take 1,000 mg by mouth daily.     clopidogrel (PLAVIX) 75 MG tablet Take 1 tablet (75 mg total) by mouth daily. 30 tablet 11   diclofenac Sodium (VOLTAREN) 1 % GEL Apply 1 application  topically 4 (four) times daily as needed (pain).     GARLIC PO Take 1 tablet by mouth daily.     hydrochlorothiazide (HYDRODIURIL) 25 MG tablet Take 25 mg by mouth daily.     isosorbide mononitrate (IMDUR) 60 MG 24 hr tablet Take 60 mg by mouth every evening.     metoprolol tartrate (LOPRESSOR) 50 MG tablet Take 50 mg by mouth 2 (two) times daily.     Omega-3 Fatty Acids (FISH OIL) 1200 MG CAPS Take 1,200 mg by mouth daily.     pantoprazole (PROTONIX) 40 MG tablet Take 1 tablet (40 mg total) by mouth daily. 30 tablet 11   ranolazine (RANEXA) 500 MG 12 hr tablet Take 500 mg by mouth 2 (two) times daily.     rosuvastatin (CRESTOR) 40 MG tablet Take 40 mg by mouth at bedtime.     zinc gluconate 50 MG tablet Take 50 mg by mouth 2 (two) times daily.     albuterol (VENTOLIN HFA) 108 (90 Base) MCG/ACT inhaler Inhale 1-2 puffs into the lungs every 6 (six) hours as needed for wheezing or shortness of breath.     atorvastatin (LIPITOR) 80 MG tablet Take 80 mg by mouth every evening.        [START ON 11/04/2020] aspirin  81 mg Oral Pre-Cath    Infusions:  sodium chloride     [START ON 11/04/2020] sodium chloride 3 mL/kg/hr (11/03/20 0719)   Followed by   Melene Muller ON 11/04/2020] sodium chloride      No Known Allergies  Social History   Socioeconomic History   Marital status: Divorced    Spouse name: Not on file   Number of children: Not on file   Years of education: Not on file   Highest education level: Not on file  Occupational History   Not on file  Tobacco Use   Smoking status: Former    Types: Cigarettes    Quit date: 10/1995    Years since quitting: 25.0   Smokeless tobacco: Never  Vaping Use   Vaping Use: Never used  Substance and Sexual Activity   Alcohol use: Not Currently   Drug use: Never   Sexual activity: Not on file  Other Topics Concern   Not on file  Social History Narrative   Not on file   Social Determinants of Health   Financial Resource Strain: Not on file   Food Insecurity: Not on file  Transportation Needs: Not on file  Physical Activity: Not on file  Stress: Not on file  Social Connections: Not on file  Intimate Partner Violence: Not on file    History reviewed. No pertinent family history.  PHYSICAL EXAM: Vitals:   11/03/20 0652  BP: 109/70  Pulse: 61  Resp: 16  Temp: 97.7 F (36.5 C)  SpO2: 98%    No intake or output data in the 24 hours ending 11/03/20 0816  General:  Well appearing. No respiratory difficulty HEENT: normal Neck: supple. no JVD. Carotids 2+ bilat; no bruits. No lymphadenopathy or thryomegaly appreciated. Cor: PMI nondisplaced. Regular rate & rhythm. No rubs, gallops or murmurs. Lungs: clear Abdomen: soft, nontender, nondistended. No hepatosplenomegaly. No bruits or masses. Good bowel sounds. Extremities: no cyanosis, clubbing, rash, edema Neuro: alert & oriented x 3, cranial nerves grossly intact. moves all 4 extremities w/o difficulty. Affect pleasant.  ECG: Normal sinus rhythm no acute changes  No results found for this or any previous visit (from the past 24 hour(s)). CARDIAC CATHETERIZATION  Result Date: 11/03/2020 Formatting of this result is different from the original.   1st Mrg lesion is 75% stenosed.   Ost RCA lesion is 99% stenosed.   The left ventricular systolic function is normal.   LV end diastolic pressure is normal.   The left ventricular ejection fraction is 55-65% by visual estimate.   There is no mitral valve regurgitation.     ASSESSMENT AND PLAN: Unstable angina with 99% lesion in the ostium of the RCA and 70% lesion in the left circumflex obtuse marginal.  Normal ejection fraction.  Patient will be going undergoing angioplasty and stenting of the ostium followed by observation overnight.  Continue home medication and observe overnight and discharge in the morning.  Azani Brogdon A

## 2020-11-04 DIAGNOSIS — I25110 Atherosclerotic heart disease of native coronary artery with unstable angina pectoris: Secondary | ICD-10-CM | POA: Diagnosis not present

## 2020-11-04 DIAGNOSIS — I200 Unstable angina: Secondary | ICD-10-CM | POA: Diagnosis not present

## 2020-11-04 LAB — BASIC METABOLIC PANEL
Anion gap: 5 (ref 5–15)
BUN: 15 mg/dL (ref 8–23)
CO2: 28 mmol/L (ref 22–32)
Calcium: 8.5 mg/dL — ABNORMAL LOW (ref 8.9–10.3)
Chloride: 106 mmol/L (ref 98–111)
Creatinine, Ser: 1.15 mg/dL (ref 0.61–1.24)
GFR, Estimated: >60 mL/min (ref >60)
Glucose, Bld: 105 mg/dL — ABNORMAL HIGH (ref 70–99)
Potassium: 3.2 mmol/L — ABNORMAL LOW (ref 3.5–5.1)
Sodium: 139 mmol/L (ref 135–145)

## 2020-11-04 LAB — CBC
HCT: 38.2 % — ABNORMAL LOW (ref 39.0–52.0)
Hemoglobin: 13.5 g/dL (ref 13.0–17.0)
MCH: 30.7 pg (ref 26.0–34.0)
MCHC: 35.3 g/dL (ref 30.0–36.0)
MCV: 86.8 fL (ref 80.0–100.0)
Platelets: 217 10*3/uL (ref 150–400)
RBC: 4.4 MIL/uL (ref 4.22–5.81)
RDW: 13.3 % (ref 11.5–15.5)
WBC: 8.1 10*3/uL (ref 4.0–10.5)
nRBC: 0 % (ref 0.0–0.2)

## 2020-11-04 MED ORDER — POTASSIUM CHLORIDE CRYS ER 20 MEQ PO TBCR
40.0000 meq | EXTENDED_RELEASE_TABLET | Freq: Once | ORAL | Status: AC
  Administered 2020-11-04: 40 meq via ORAL
  Filled 2020-11-04: qty 2

## 2020-11-04 NOTE — Discharge Summary (Signed)
Scott Parker:[id number] DOB: [birth year] DOA: 11/03/2020  PCP: System, Provider Not In  Admit date: 11/03/2020 Discharge date: 11/04/2020  Admitted From: Home Disposition: Home  Recommendations for Outpatient Follow-up:  Follow up with PCP in 1 week Please obtain BMP/CBC in one week Please follow up with Dr. Welton Flakes, cardiology as scheduled     Discharge Condition:Stable CODE STATUS: Full Diet recommendation: Heart Healthy  Brief/Interim Summary: Scott Parker is a 75 y.o. male with medical history significant for COPD, coronary artery disease, hypertension, GERD and dyslipidemia who was seen by his cardiologist in the outpatient setting for evaluation of chest pain.  He had a nuclear stress test and was found to have inferior ischemia.  CTA coronaries was done which showed an ostial lesion in the RCA with recommendation for cardiac catheterization. Patient is status post cardiac cath which showed 99% ostial lesion in the right coronary and 70% in the left circumflex obtuse marginal. Patient is status post successful angioplasty and drug-eluting stent placement to the ostial right coronary artery.  Plan was to keep him overnight and if stable discharge home in a.m.  He denies any shortness of breath or chest pain or anginal-like symptoms this AM.  He is stable for discharge.  He already has scheduled follow-up with Dr. Welton Flakes.  Discharge Diagnoses:  Principal Problem:   Unstable angina (HCC) Active Problems:   CAD (coronary artery disease)   Essential hypertension   GERD (gastroesophageal reflux disease)    Discharge Instructions  Discharge Instructions     AMB Referral to Cardiac Rehabilitation - Phase II   Complete by: As directed    Diagnosis:  Coronary Stents Stable Angina     After initial evaluation and assessments completed: Virtual Based Care may be provided alone or in conjunction with Phase 2 Cardiac Rehab based on patient barriers.: Yes   Call MD for:  severe  uncontrolled pain   Complete by: As directed    Diet - low sodium heart healthy   Complete by: As directed    Discharge instructions   Complete by: As directed    F/u with Dr. Vear Clock activity slowly   Complete by: As directed       Allergies as of 11/04/2020   No Known Allergies      Medication List     STOP taking these medications    diclofenac Sodium 1 % Gel Commonly known as: VOLTAREN   rosuvastatin 40 MG tablet Commonly known as: CRESTOR       TAKE these medications    acetaminophen 325 MG tablet Commonly known as: TYLENOL Take 650 mg by mouth 2 (two) times daily.   albuterol 108 (90 Base) MCG/ACT inhaler Commonly known as: VENTOLIN HFA Inhale 1-2 puffs into the lungs every 6 (six) hours as needed for wheezing or shortness of breath.   aspirin EC 81 MG tablet Take 81 mg by mouth daily. Swallow whole.   atorvastatin 80 MG tablet Commonly known as: LIPITOR Take 80 mg by mouth every evening.   budesonide-formoterol 160-4.5 MCG/ACT inhaler Commonly known as: SYMBICORT Inhale 1 puff into the lungs daily.   calcium carbonate 500 MG chewable tablet Commonly known as: TUMS - dosed in mg elemental calcium Chew 500 mg by mouth daily as needed for indigestion or heartburn.   CINNAMON PO Take 1,000 mg by mouth daily.   CITRACAL PO Take 1 tablet by mouth daily.   clopidogrel 75 MG tablet Commonly known as: Plavix Take 1 tablet (  75 mg total) by mouth daily.   Fish Oil 1200 MG Caps Take 1,200 mg by mouth daily.   GARLIC PO Take 1 tablet by mouth daily.   hydrochlorothiazide 25 MG tablet Commonly known as: HYDRODIURIL Take 25 mg by mouth daily.   isosorbide mononitrate 60 MG 24 hr tablet Commonly known as: IMDUR Take 60 mg by mouth every evening.   metoprolol tartrate 50 MG tablet Commonly known as: LOPRESSOR Take 50 mg by mouth 2 (two) times daily.   pantoprazole 40 MG tablet Commonly known as: Protonix Take 1 tablet (40 mg total) by  mouth daily.   ranolazine 500 MG 12 hr tablet Commonly known as: RANEXA Take 500 mg by mouth 2 (two) times daily.   zinc gluconate 50 MG tablet Take 50 mg by mouth 2 (two) times daily.        Follow-up Information     Laurier Nancy, MD Follow up in 1 week(s).   Specialty: Cardiology Contact information: [address] [place] [address] [phone]                No Known Allergies  Consultations: Cardiology   Procedures/Studies: CARDIAC CATHETERIZATION  Result Date: 11/03/2020 Formatting of this result is different from the original.   1st Mrg lesion is 75% stenosed.   Ost RCA to Prox RCA lesion is 99% stenosed.   A drug-eluting stent was successfully placed using a STENT ONYX FRONTIER 4.0X15.   Post intervention, there is a 0% residual stenosis. Successful angioplasty and drug-eluting stent placement to the ostial right coronary artery. Recommendations: Dual antiplatelet therapy for at least 6 months. Aggressive treatment of risk factors. Treat residual coronary artery disease medically.  The diseased OM branch is small in diameter about 2 mm.   CARDIAC CATHETERIZATION  Result Date: 11/03/2020 Formatting of this result is different from the original.   1st Mrg lesion is 75% stenosed.   Ost RCA lesion is 99% stenosed.   The left ventricular systolic function is normal.   LV end diastolic pressure is normal.   The left ventricular ejection fraction is 55-65% by visual estimate.   There is no mitral valve regurgitation.      Subjective: No chest pain, or anginal-like symptoms  Discharge Exam: Vitals:   11/04/20 0736 11/04/20 1024  BP: 116/73 125/84  Pulse: 62 73  Resp: 18 19  Temp: 97.8 F (36.6 C) 97.9 F (36.6 C)  SpO2: 100% 98%   Vitals:   11/03/20 1622 11/03/20 1932 11/04/20 0736 11/04/20 1024  BP: 117/79 123/77 116/73 125/84  Pulse: 65 61 62 73  Resp:  20 18 19   Temp: 97.9 F (36.6 C) 97.7 F (36.5 C) 97.8 F (36.6 C) 97.9 F (36.6 C)   TempSrc:      SpO2: 99% 98% 100% 98%  Weight:      Height:        General: Pt is alert, awake, not in acute distress Cardiovascular: RRR, S1/S2 +, no rubs, no gallops Respiratory: CTA bilaterally, no wheezing, no rhonchi Abdominal: Soft, NT, ND, bowel sounds + Extremities: no edema, no cyanosis    The results of significant diagnostics from this hospitalization (including imaging, microbiology, ancillary and laboratory) are listed below for reference.     Microbiology: No results found for this or any previous visit (from the past 240 hour(s)).   Labs: BNP (last 3 results) No results for input(s): BNP in the last 8760 hours. Basic Metabolic Panel: Recent Labs  Lab 11/03/20  1349 11/04/20 0420  NA  --  139  K  --  3.2*  CL  --  106  CO2  --  28  GLUCOSE  --  105*  BUN  --  15  CREATININE 0.99 1.15  CALCIUM  --  8.5*   Liver Function Tests: No results for input(s): AST, ALT, ALKPHOS, BILITOT, PROT, ALBUMIN in the last 168 hours. No results for input(s): LIPASE, AMYLASE in the last 168 hours. No results for input(s): AMMONIA in the last 168 hours. CBC: Recent Labs  Lab 11/04/20 0420  WBC 8.1  HGB 13.5  HCT 38.2*  MCV 86.8  PLT 217   Cardiac Enzymes: No results for input(s): CKTOTAL, CKMB, CKMBINDEX, TROPONINI in the last 168 hours. BNP: Invalid input(s): POCBNP CBG: No results for input(s): GLUCAP in the last 168 hours. D-Dimer No results for input(s): DDIMER in the last 72 hours. Hgb A1c No results for input(s): HGBA1C in the last 72 hours. Lipid Profile No results for input(s): CHOL, HDL, LDLCALC, TRIG, CHOLHDL, LDLDIRECT in the last 72 hours. Thyroid function studies No results for input(s): TSH, T4TOTAL, T3FREE, THYROIDAB in the last 72 hours.  Invalid input(s): FREET3 Anemia work up No results for input(s): VITAMINB12, FOLATE, FERRITIN, TIBC, IRON, RETICCTPCT in the last 72 hours. Urinalysis No results found for: COLORURINE, APPEARANCEUR,  LABSPEC, PHURINE, GLUCOSEU, HGBUR, BILIRUBINUR, KETONESUR, PROTEINUR, UROBILINOGEN, NITRITE, LEUKOCYTESUR Sepsis Labs Invalid input(s): PROCALCITONIN,  WBC,  LACTICIDVEN Microbiology No results found for this or any previous visit (from the past 240 hour(s)).   Time coordinating discharge: Over 30 minutes  SIGNED:   Lynn Ito, MD  [facility] 11/04/2020, 10:55 AM Pager   If 7PM-7AM, please contact night-coverage [email or web address] Password [email or web address]

## 2020-11-04 NOTE — Progress Notes (Signed)
SUBJECTIVE:  75 year old white male with a past medical history of coronary artery disease hypertension hyperlipidemia presented to the office with chest pain and was found to have inferior ischemia on a nuclear stress test and on CTA coronaries ostial lesion in the RCA this was referred for cardiac catheterization.  Cardiac catheterization confirmed ostial 99% lesion in the right coronary and 70% OM1.  Left ventricular ejection fraction was normal.  Patient underwent angioplasty 11/03/20. Observed over night.  Patient denies chest pain, shortness of breath, dizziness.     Vitals:   11/03/20 1333 11/03/20 1622 11/03/20 1932 11/04/20 0736  BP: 130/77 117/79 123/77 116/73  Pulse: 64 65 61 62  Resp: 18  20 18   Temp: (!) 97.2 F (36.2 C) 97.9 F (36.6 C) 97.7 F (36.5 C) 97.8 F (36.6 C)  TempSrc:      SpO2: 98% 99% 98% 100%  Weight: 79.2 kg     Height:        Intake/Output Summary (Last 24 hours) at 11/04/2020 0832 Last data filed at 11/04/2020 0500 Gross per 24 hour  Intake 240 ml  Output 1000 ml  Net -760 ml    LABS: Basic Metabolic Panel: Recent Labs    11/03/20 1349 11/04/20 0420  NA  --  139  K  --  3.2*  CL  --  106  CO2  --  28  GLUCOSE  --  105*  BUN  --  15  CREATININE 0.99 1.15  CALCIUM  --  8.5*   Liver Function Tests: No results for input(s): AST, ALT, ALKPHOS, BILITOT, PROT, ALBUMIN in the last 72 hours. No results for input(s): LIPASE, AMYLASE in the last 72 hours. CBC: Recent Labs    11/04/20 0420  WBC 8.1  HGB 13.5  HCT 38.2*  MCV 86.8  PLT 217   Cardiac Enzymes: No results for input(s): CKTOTAL, CKMB, CKMBINDEX, TROPONINI in the last 72 hours. BNP: Invalid input(s): POCBNP D-Dimer: No results for input(s): DDIMER in the last 72 hours. Hemoglobin A1C: No results for input(s): HGBA1C in the last 72 hours. Fasting Lipid Panel: No results for input(s): CHOL, HDL, LDLCALC, TRIG, CHOLHDL, LDLDIRECT in the last 72 hours. Thyroid Function Tests: No  results for input(s): TSH, T4TOTAL, T3FREE, THYROIDAB in the last 72 hours.  Invalid input(s): FREET3 Anemia Panel: No results for input(s): VITAMINB12, FOLATE, FERRITIN, TIBC, IRON, RETICCTPCT in the last 72 hours.   PHYSICAL EXAM General: Well developed, well nourished, in no acute distress HEENT:  Normocephalic and atramatic Neck:  No JVD.  Lungs: Clear bilaterally to auscultation and percussion. Heart: HRRR . Normal S1 and S2 without gallops or murmurs.  Abdomen: Bowel sounds are positive, abdomen soft and non-tender  Msk:  Back normal, normal gait. Normal strength and tone for age. Extremities: No clubbing, cyanosis or edema.   Neuro: Alert and oriented X 3. Psych:  Good affect, responds appropriately  TELEMETRY: NSR, HR 62  ASSESSMENT AND PLAN: Patient has no acute complaints this morning. Femoral site clean dry and intact. No hematoma. No complaints of pain. Resting comfortably. Patient going home today. Follow up in office Thursday 11/09/20.  Principal Problem:   Unstable angina (HCC) Active Problems:   CAD (coronary artery disease)   Essential hypertension   GERD (gastroesophageal reflux disease)    Sathvik Tiedt, FNP-C 11/04/2020 8:32 AM

## 2020-11-04 NOTE — Progress Notes (Signed)
Scott Parker to be D/C'd Home per MD order.  Discussed prescriptions and follow up appointments with the patient. Prescriptions given to patient, medication list explained in detail. Pt verbalized understanding.  Allergies as of 11/04/2020   No Known Allergies      Medication List     STOP taking these medications    diclofenac Sodium 1 % Gel Commonly known as: VOLTAREN   rosuvastatin 40 MG tablet Commonly known as: CRESTOR       TAKE these medications    acetaminophen 325 MG tablet Commonly known as: TYLENOL Take 650 mg by mouth 2 (two) times daily.   albuterol 108 (90 Base) MCG/ACT inhaler Commonly known as: VENTOLIN HFA Inhale 1-2 puffs into the lungs every 6 (six) hours as needed for wheezing or shortness of breath.   aspirin EC 81 MG tablet Take 81 mg by mouth daily. Swallow whole.   atorvastatin 80 MG tablet Commonly known as: LIPITOR Take 80 mg by mouth every evening.   budesonide-formoterol 160-4.5 MCG/ACT inhaler Commonly known as: SYMBICORT Inhale 1 puff into the lungs daily.   calcium carbonate 500 MG chewable tablet Commonly known as: TUMS - dosed in mg elemental calcium Chew 500 mg by mouth daily as needed for indigestion or heartburn.   CINNAMON PO Take 1,000 mg by mouth daily.   CITRACAL PO Take 1 tablet by mouth daily.   clopidogrel 75 MG tablet Commonly known as: Plavix Take 1 tablet (75 mg total) by mouth daily.   Fish Oil 1200 MG Caps Take 1,200 mg by mouth daily.   GARLIC PO Take 1 tablet by mouth daily.   hydrochlorothiazide 25 MG tablet Commonly known as: HYDRODIURIL Take 25 mg by mouth daily.   isosorbide mononitrate 60 MG 24 hr tablet Commonly known as: IMDUR Take 60 mg by mouth every evening.   metoprolol tartrate 50 MG tablet Commonly known as: LOPRESSOR Take 50 mg by mouth 2 (two) times daily.   pantoprazole 40 MG tablet Commonly known as: Protonix Take 1 tablet (40 mg total) by mouth daily.   ranolazine 500 MG  12 hr tablet Commonly known as: RANEXA Take 500 mg by mouth 2 (two) times daily.   zinc gluconate 50 MG tablet Take 50 mg by mouth 2 (two) times daily.        Vitals:   11/04/20 0736 11/04/20 1024  BP: 116/73 125/84  Pulse: 62 73  Resp: 18 19  Temp: 97.8 F (36.6 C) 97.9 F (36.6 C)  SpO2: 100% 98%   Tele box removed and returned. IV catheter discontinued intact. Site without signs and symptoms of complications. Dressing and pressure applied. Pt denies pain at this time. No complaints noted.  An After Visit Summary was printed and given to the patient. Patient escorted via WC, and D/C home via private auto.  Rigoberto Noel

## 2021-03-31 ENCOUNTER — Emergency Department: Payer: Medicare Other

## 2021-03-31 ENCOUNTER — Other Ambulatory Visit: Payer: Self-pay

## 2021-03-31 ENCOUNTER — Emergency Department
Admission: EM | Admit: 2021-03-31 | Discharge: 2021-03-31 | Disposition: A | Discharge status: Home / Self Care | Payer: Medicare Other | Attending: Emergency Medicine | Admitting: Emergency Medicine

## 2021-03-31 DIAGNOSIS — Z87891 Personal history of nicotine dependence: Secondary | ICD-10-CM | POA: Insufficient documentation

## 2021-03-31 DIAGNOSIS — I10 Essential (primary) hypertension: Secondary | ICD-10-CM | POA: Insufficient documentation

## 2021-03-31 DIAGNOSIS — Z7982 Long term (current) use of aspirin: Secondary | ICD-10-CM | POA: Diagnosis not present

## 2021-03-31 DIAGNOSIS — Z79899 Other long term (current) drug therapy: Secondary | ICD-10-CM | POA: Insufficient documentation

## 2021-03-31 DIAGNOSIS — K219 Gastro-esophageal reflux disease without esophagitis: Secondary | ICD-10-CM | POA: Diagnosis not present

## 2021-03-31 DIAGNOSIS — I2510 Atherosclerotic heart disease of native coronary artery without angina pectoris: Secondary | ICD-10-CM | POA: Insufficient documentation

## 2021-03-31 DIAGNOSIS — R519 Headache, unspecified: Secondary | ICD-10-CM | POA: Insufficient documentation

## 2021-03-31 DIAGNOSIS — M542 Cervicalgia: Secondary | ICD-10-CM | POA: Insufficient documentation

## 2021-03-31 DIAGNOSIS — R0789 Other chest pain: Secondary | ICD-10-CM | POA: Diagnosis not present

## 2021-03-31 DIAGNOSIS — Z7902 Long term (current) use of antithrombotics/antiplatelets: Secondary | ICD-10-CM | POA: Diagnosis not present

## 2021-03-31 DIAGNOSIS — J449 Chronic obstructive pulmonary disease, unspecified: Secondary | ICD-10-CM | POA: Insufficient documentation

## 2021-03-31 DIAGNOSIS — R079 Chest pain, unspecified: Secondary | ICD-10-CM

## 2021-03-31 DIAGNOSIS — Z96643 Presence of artificial hip joint, bilateral: Secondary | ICD-10-CM | POA: Insufficient documentation

## 2021-03-31 LAB — CBC WITH DIFFERENTIAL/PLATELET
Abs Immature Granulocytes: 0.03 10*3/uL (ref 0.00–0.07)
Basophils Absolute: 0.1 10*3/uL (ref 0.0–0.1)
Basophils Relative: 1 %
Eosinophils Absolute: 0.4 10*3/uL (ref 0.0–0.5)
Eosinophils Relative: 5 %
HCT: 42.1 % (ref 39.0–52.0)
Hemoglobin: 14.4 g/dL (ref 13.0–17.0)
Immature Granulocytes: 0 %
Lymphocytes Relative: 28 %
Lymphs Abs: 2.1 10*3/uL (ref 0.7–4.0)
MCH: 30.4 pg (ref 26.0–34.0)
MCHC: 34.2 g/dL (ref 30.0–36.0)
MCV: 88.8 fL (ref 80.0–100.0)
Monocytes Absolute: 0.7 10*3/uL (ref 0.1–1.0)
Monocytes Relative: 9 %
Neutro Abs: 4.4 10*3/uL (ref 1.7–7.7)
Neutrophils Relative %: 57 %
Platelets: 235 10*3/uL (ref 150–400)
RBC: 4.74 MIL/uL (ref 4.22–5.81)
RDW: 12.7 % (ref 11.5–15.5)
WBC: 7.7 10*3/uL (ref 4.0–10.5)
nRBC: 0 % (ref 0.0–0.2)

## 2021-03-31 LAB — COMPREHENSIVE METABOLIC PANEL
ALT: 26 U/L (ref 0–44)
AST: 22 U/L (ref 15–41)
Albumin: 3.5 g/dL (ref 3.5–5.0)
Alkaline Phosphatase: 100 U/L (ref 38–126)
Anion gap: 7 (ref 5–15)
BUN: 19 mg/dL (ref 8–23)
CO2: 29 mmol/L (ref 22–32)
Calcium: 8.9 mg/dL (ref 8.9–10.3)
Chloride: 102 mmol/L (ref 98–111)
Creatinine, Ser: 1.1 mg/dL (ref 0.61–1.24)
GFR, Estimated: >60 mL/min (ref >60)
Glucose, Bld: 129 mg/dL — ABNORMAL HIGH (ref 70–99)
Potassium: 3.5 mmol/L (ref 3.5–5.1)
Sodium: 138 mmol/L (ref 135–145)
Total Bilirubin: 0.8 mg/dL (ref 0.3–1.2)
Total Protein: 6.6 g/dL (ref 6.5–8.1)

## 2021-03-31 LAB — TROPONIN I (HIGH SENSITIVITY)
Troponin I (High Sensitivity): 7 ng/L (ref <18)
Troponin I (High Sensitivity): 6 ng/L (ref <18)

## 2021-03-31 LAB — LIPASE, BLOOD: Lipase: 35 U/L (ref 11–51)

## 2021-03-31 MED ORDER — ALUM & MAG HYDROXIDE-SIMETH 200-200-20 MG/5ML PO SUSP
15.0000 mL | Freq: Once | ORAL | Status: AC
  Administered 2021-03-31: 15 mL via ORAL
  Filled 2021-03-31: qty 30

## 2021-03-31 MED ORDER — SUCRALFATE 1 G PO TABS
1.0000 g | ORAL_TABLET | Freq: Once | ORAL | Status: AC
  Administered 2021-03-31: 1 g via ORAL
  Filled 2021-03-31: qty 1

## 2021-03-31 NOTE — ED Provider Notes (Signed)
Emergency Medicine Provider Triage Evaluation Note  Scott Parker , a 75 y.o. male  was evaluated in triage.  Pt complains of chest pain, neck pain.  Denies fall/injury.  Review of Systems  Positive: Chest pain, neck pain Negative: Shortness of breath  Physical Exam  BP 122/78    Pulse 73    Temp 98.2 F (36.8 C) (Oral)    Resp 18    SpO2 100%  Gen:   Awake, no distress   Resp:  Normal effort  MSK:   Moves extremities without difficulty  Other:    Medical Decision Making  Medically screening exam initiated at 6:11 AM.  Appropriate orders placed.  Scott Parker was informed that the remainder of the evaluation will be completed by another provider, this initial triage assessment does not replace that evaluation, and the importance of remaining in the ED until their evaluation is complete.  75 year old male presenting with chest pain and neck pain.  Will obtain cardiac panel, chest x-ray while patient awaits treatment room.   Irean Hong, MD 03/31/21 [phone]

## 2021-03-31 NOTE — ED Notes (Signed)
Per CT tech pt refused CT scan, states he wants to leave and will have his PCP order scans if they think that they are needed.

## 2021-03-31 NOTE — ED Provider Notes (Signed)
[facility] Emergency Department Provider Note  ____________________________________________   Event Date/Time   First MD Initiated Contact with Patient 03/31/21 1031     (approximate)  I have reviewed the triage vital signs and the nursing notes.   HISTORY  Chief Complaint Neck Pain and Chest Pain   HPI Scott Parker is a 75 y.o. male with below noted past medical history who presents for assessment of some substernal burning discomfort that he experienced last night after dinner.  Patient states it feels like some reflux he has had in the past.  States he feels like he needs a Tums.  States that it resolved after a few minutes.  He is currently chest pain-free.  He also notes that over the last 2 or 3 weeks he has had some increasing neck pain and will sometimes feel little bit lightheaded.  He states he thinks is from some arthritis versus possibly complication from the surgery he had over a year ago in his neck.  He has not had any new headache today, back pain, cough, shortness of breath, abdominal pain, nausea, vomiting, diarrhea or urinary symptoms.  Does endorse some chronic constipation.  Denies any recent EtOH use, tobacco abuse or illicit drug use.  No other acute concerns at this time.          Past Medical History:  Diagnosis Date   Arthritis    spinal column   COPD (chronic obstructive pulmonary disease) (HCC)    Coronary artery disease    Dyspnea    with exertion   Dysrhythmia    GERD (gastroesophageal reflux disease)    Gout    right foot   HOH (hard of hearing)    Hypercholesterolemia    Hyperlipemia    Hypertension    MRSA (methicillin resistant Staphylococcus aureus) 2016   HX of right lower leg   Wears dentures    full upper, partial lower    Patient Active Problem List   Diagnosis Date Noted   Effort angina (HCC) 11/03/2020   Chest pain 11/03/2020   Unstable angina (HCC) 10/26/2020   Carotid stenosis 06/30/2020    CAD (coronary artery disease) 06/04/2020   Essential hypertension 06/04/2020   Hyperlipidemia 06/04/2020   GERD (gastroesophageal reflux disease) 06/04/2020   Fracture of phalanx of thumb 06/01/2020   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, bilateral 06/01/2020   Cervical myelopathy (HCC) 05/28/2017    Past Surgical History:  Procedure Laterality Date   CARDIAC CATHETERIZATION  12/2019   CAROTID PTA/STENT INTERVENTION Left 06/28/2020   Procedure: CAROTID PTA/STENT INTERVENTION;  Surgeon: Renford Dills, MD;  Location: [facility] INVASIVE CV [facility];  Service: Cardiovascular;  Laterality: Left;   CATARACT EXTRACTION W/PHACO Left 02/22/2020   Procedure: CATARACT EXTRACTION PHACO AND INTRAOCULAR LENS PLACEMENT (IOC) LEFT 4.45 00:41.2;  Surgeon: Galen Manila, MD;  Location: [facility];  Service: Ophthalmology;  Laterality: Left;   CATARACT EXTRACTION W/PHACO Right 03/14/2020   Procedure: CATARACT EXTRACTION PHACO AND INTRAOCULAR LENS PLACEMENT (IOC) RIGHT 6.06 00:50.4;  Surgeon: Galen Manila, MD;  Location: [facility];  Service: Ophthalmology;  Laterality: Right;   COLONOSCOPY     pt states he has had x 2   CORONARY STENT INTERVENTION N/A 11/03/2020   Procedure: CORONARY STENT INTERVENTION;  Surgeon: Iran Ouch, MD;  Location: [facility] INVASIVE CV [facility];  Service: Cardiovascular;  Laterality: N/A;   fusion     cervical 3-7, repair ruptured disc   JOINT REPLACEMENT Bilateral  bilateral hip replacements   LEFT HEART CATH N/A 01/06/2020   Procedure: Left Heart Cath with possible intervention;  Surgeon: Laurier Nancy, MD;  Location: [facility] INVASIVE CV LAB;  Service: Cardiovascular;  Laterality: N/A;   LEFT HEART CATH AND CORONARY ANGIOGRAPHY Right 11/03/2020   Procedure: LEFT HEART CATH AND CORONARY ANGIOGRAPHY with Possible Interventon;  Surgeon: Laurier Nancy, MD;  Location: [facility] INVASIVE CV [facility];  Service: Cardiovascular;  Laterality: Right;   TOTAL HIP ARTHROPLASTY      bilateral    Prior to Admission medications   Medication Sig Start Date End Date Taking? Authorizing Provider  acetaminophen (TYLENOL) 325 MG tablet Take 650 mg by mouth 2 (two) times daily.    [provider]  albuterol (VENTOLIN HFA) 108 (90 Base) MCG/ACT inhaler Inhale 1-2 puffs into the lungs every 6 (six) hours as needed for wheezing or shortness of breath.    [provider]  aspirin EC 81 MG tablet Take 81 mg by mouth daily. Swallow whole.    [provider]  atorvastatin (LIPITOR) 80 MG tablet Take 80 mg by mouth every evening.    [provider]  budesonide-formoterol (SYMBICORT) 160-4.5 MCG/ACT inhaler Inhale 1 puff into the lungs daily.    [provider]  calcium carbonate (TUMS - DOSED IN MG ELEMENTAL CALCIUM) 500 MG chewable tablet Chew 500 mg by mouth daily as needed for indigestion or heartburn.    [provider]  Calcium Citrate (CITRACAL PO) Take 1 tablet by mouth daily.    [provider]  CINNAMON PO Take 1,000 mg by mouth daily.    [provider]  clopidogrel (PLAVIX) 75 MG tablet Take 1 tablet (75 mg total) by mouth daily. 06/01/20   Schnier, Latina Craver, MD  GARLIC PO Take 1 tablet by mouth daily.    [provider]  hydrochlorothiazide (HYDRODIURIL) 25 MG tablet Take 25 mg by mouth daily. 12/07/19   [provider]  isosorbide mononitrate (IMDUR) 60 MG 24 hr tablet Take 60 mg by mouth every evening. 10/09/20   [provider]  metoprolol tartrate (LOPRESSOR) 50 MG tablet Take 50 mg by mouth 2 (two) times daily. 12/30/19   [provider]  Omega-3 Fatty Acids (FISH OIL) 1200 MG CAPS Take 1,200 mg by mouth daily.    [provider]  pantoprazole (PROTONIX) 40 MG tablet Take 1 tablet (40 mg total) by mouth daily. 06/01/20   Schnier, Latina Craver, MD  ranolazine (RANEXA) 500 MG 12 hr tablet Take 500 mg by mouth 2 (two) times daily.    [provider]  zinc  gluconate 50 MG tablet Take 50 mg by mouth 2 (two) times daily.    [provider]    Allergies Patient has no known allergies.  Family History  Problem Relation Age of Onset   Hypertension Mother     Social History Social History   Tobacco Use   Smoking status: Former    Types: Cigarettes    Quit date: 10/1995    Years since quitting: 25.4   Smokeless tobacco: Never  Vaping Use   Vaping Use: Never used  Substance Use Topics   Alcohol use: Not Currently   Drug use: Never    Review of Systems  Review of Systems  Constitutional:  Negative for chills and fever.  HENT:  Negative for sore throat.   Eyes:  Negative for pain.  Respiratory:  Negative for cough and stridor.   Cardiovascular:  Positive  for chest pain (resolved).  Gastrointestinal:  Negative for vomiting.  Genitourinary:  Negative for dysuria.  Musculoskeletal:  Positive for neck pain (subacute to chronic).  Skin:  Negative for rash.  Neurological:  Negative for seizures, loss of consciousness and headaches.  Psychiatric/Behavioral:  Negative for suicidal ideas.   All other systems reviewed and are negative.    ____________________________________________   PHYSICAL EXAM:  VITAL SIGNS: ED Triage Vitals  Enc Vitals Group     BP 03/31/21 0607 122/78     Pulse Rate 03/31/21 0607 73     Resp 03/31/21 0607 18     Temp 03/31/21 0611 98.2 F (36.8 C)     Temp Source 03/31/21 0611 Oral     SpO2 03/31/21 0607 100 %     Weight --      Height --      Head Circumference --      Peak Flow --      Pain Score 03/31/21 0609 0     Pain Loc --      Pain Edu? --      Excl. in GC? --    Vitals:   03/31/21 0611 03/31/21 1002  BP:  (!) 143/92  Pulse:  68  Resp:  16  Temp: 98.2 F (36.8 C) 97.6 F (36.4 C)  SpO2:  99%   Physical Exam Vitals and nursing note reviewed.  Constitutional:      General: He is not in acute distress.    Appearance: He is well-developed.  HENT:     Head:  Normocephalic and atraumatic.     Right Ear: External ear normal.     Left Ear: External ear normal.     Nose: Nose normal.  Eyes:     Conjunctiva/sclera: Conjunctivae normal.  Cardiovascular:     Rate and Rhythm: Normal rate and regular rhythm.     Heart sounds: No murmur heard. Pulmonary:     Effort: Pulmonary effort is normal. No respiratory distress.     Breath sounds: Normal breath sounds.  Abdominal:     Palpations: Abdomen is soft.     Tenderness: There is no abdominal tenderness.  Musculoskeletal:        General: No swelling.     Cervical back: Neck supple.  Skin:    General: Skin is warm and dry.     Capillary Refill: Capillary refill takes less than 2 seconds.  Neurological:     Mental Status: He is alert and oriented to person, place, and time.  Psychiatric:        Mood and Affect: Mood normal.    Cranial nerves II through XII grossly intact.  No pronator drift.  No finger dysmetria.  Symmetric 5/5 strength of all extremities.  Sensation intact to light touch in all extremities.  Unremarkable unassisted gait.  ____________________________________________   LABS (all labs ordered are listed, but only abnormal results are displayed)  Labs Reviewed  COMPREHENSIVE METABOLIC PANEL - Abnormal; Notable for the following components:      Result Value   Glucose, Bld 129 (*)    All other components within normal limits  CBC WITH DIFFERENTIAL/PLATELET  LIPASE, BLOOD  TROPONIN I (HIGH SENSITIVITY)  TROPONIN I (HIGH SENSITIVITY)   ____________________________________________  EKG  ECG remarkable for sinus rhythm with a ventricular rate of 73, normal axis, unremarkable intervals without clear evidence of acute ischemia or significant arrhythmia. ____________________________________________  RADIOLOGY  ED MD interpretation: Chest x-ray shows no focal consolidation, effusion, edema, pneumothorax  or other clear acute process.  CT head is unremarkable for evidence of  hemorrhage, edema, mass-effect or other clear acute process.  CT C-spine shows no acute fracture dislocation and fairly significant disc degenerative changes.  Official radiology report(s): DG Chest 2 View  Result Date: 03/31/2021 CLINICAL DATA:  Chest pain EXAM: CHEST - 2 VIEW COMPARISON:  12/06/2019 FINDINGS: Heart size is normal. No pleural effusion or edema. No airspace opacities identified. Chronic interstitial coarsening is noted throughout both lungs. Compression deformity with loss of approximately 30% of the vertebral body height noted within the midthoracic spine. This is unchanged compared with previous exam. IMPRESSION: 1. No acute cardiopulmonary abnormalities. 2. Chronic interstitial coarsening. Electronically Signed   By: Signa Kell M.D.   On: 03/31/2021 07:12   CT HEAD WO CONTRAST ( )  Result Date: 03/31/2021 CLINICAL DATA:  Patient had chest pain last night and woke with neck pain. EXAM: CT HEAD WITHOUT CONTRAST CT CERVICAL SPINE WITHOUT CONTRAST TECHNIQUE: Multidetector CT imaging of the head and cervical spine was performed following the standard protocol without intravenous contrast. Multiplanar CT image reconstructions of the cervical spine were also generated. COMPARISON:  None. FINDINGS: CT HEAD FINDINGS Brain: No evidence of acute infarction, hemorrhage, hydrocephalus, extra-axial collection or mass lesion/mass effect. Vascular: No hyperdense vessel or unexpected calcification. Skull: Normal. Negative for fracture or focal lesion. Sinuses/Orbits: Globes and orbits are unremarkable. Visualized sinuses are clear. Other: None. CT CERVICAL SPINE FINDINGS Alignment: Kyphosis, apex at C4.  No spondylolisthesis. Skull base and vertebrae: No acute fracture. No primary bone lesion or focal pathologic process. Soft tissues and spinal canal: No prevertebral fluid or swelling. No visible canal hematoma. Disc levels: Previous left laminectomies at C4, C5 and C6 with fusion plate  spanning a laminectomy defects. Mild disc bulging and endplate spurring noted at these levels. No convincing disc herniation. Upper chest: Negative. Other: None. IMPRESSION: HEAD CT 1. No acute intracranial abnormalities. CERVICAL CT 1. No fracture or acute finding. 2. Postsurgical and significant disc degenerative changes. Electronically Signed   By: Amie Portland M.D.   On: 03/31/2021 12:49   CT Cervical Spine Wo Contrast  Result Date: 03/31/2021 CLINICAL DATA:  Patient had chest pain last night and woke with neck pain. EXAM: CT HEAD WITHOUT CONTRAST CT CERVICAL SPINE WITHOUT CONTRAST TECHNIQUE: Multidetector CT imaging of the head and cervical spine was performed following the standard protocol without intravenous contrast. Multiplanar CT image reconstructions of the cervical spine were also generated. COMPARISON:  None. FINDINGS: CT HEAD FINDINGS Brain: No evidence of acute infarction, hemorrhage, hydrocephalus, extra-axial collection or mass lesion/mass effect. Vascular: No hyperdense vessel or unexpected calcification. Skull: Normal. Negative for fracture or focal lesion. Sinuses/Orbits: Globes and orbits are unremarkable. Visualized sinuses are clear. Other: None. CT CERVICAL SPINE FINDINGS Alignment: Kyphosis, apex at C4.  No spondylolisthesis. Skull base and vertebrae: No acute fracture. No primary bone lesion or focal pathologic process. Soft tissues and spinal canal: No prevertebral fluid or swelling. No visible canal hematoma. Disc levels: Previous left laminectomies at C4, C5 and C6 with fusion plate spanning a laminectomy defects. Mild disc bulging and endplate spurring noted at these levels. No convincing disc herniation. Upper chest: Negative. Other: None. IMPRESSION: HEAD CT 1. No acute intracranial abnormalities. CERVICAL CT 1. No fracture or acute finding. 2. Postsurgical and significant disc degenerative changes. Electronically Signed   By: Amie Portland M.D.   On: 03/31/2021 12:49     ____________________________________________   PROCEDURES  Procedure(s) performed (including Critical Care):  Procedures   ____________________________________________   INITIAL IMPRESSION / ASSESSMENT AND PLAN / ED COURSE      Patient presents with above stated history exam primarily with concern for chest pain.  Legs also complaining of some subacute to chronic neck soreness and intermittent lightheadedness for the last couple weeks which did not seem any different today and he is currently pain-free in his chest and neck and does not feel lightheaded.  On arrival he is afebrile and hemodynamically stable.   Initially considered possible dissection I think this is very unlikely given patient describes some burning substernal pain after a meal that resolved after a few minutes and has not clearly temporally correlated with the soreness in his neck.  He has a nonfocal neurological exam is not consistent with a CVA.  Additional differential considerations include ACS, arrhythmia, pneumonia, pneumothorax, PE and others.  Overall I have very low suspicion for PE at this time as patient denying any symptoms.  Not tachycardic hypoxic or tachypneic.  ECG remarkable for sinus rhythm with a ventricular rate of 73, normal axis, unremarkable intervals without clear evidence of acute ischemia or significant arrhythmia.  Low suspicion for ACS given nonelevated troponins x2.  CMP without significant electrolyte or metabolic derangements.  CBC shows no leukocytosis or acute anemia.  Patient not consistent with pancreatitis.  Chest x-ray shows no focal consolidation, effusion, edema, pneumothorax or other clear acute process.  CT head is unremarkable for evidence of hemorrhage, edema, mass-effect or other clear acute process.  CT C-spine shows no acute fracture dislocation and fairly significant disc degenerative changes.  Overall unclear etiology of patient's reported headache earlier that has  since resolved.  I suspect that the neck pain is from worsening degenerative changes has been worsening over several weeks to perhaps a little over a month.  Given patient stable vitals stating he is currently asymptomatic with nonfocal neurological exam and reassuring work-up I think is stable for discharge with continued outpatient evaluation.  We will have him follow-up with his cardiologist Dr. Briscoe Burns saw him in 2018.  Discharged in stable condition.  Strict return precautions advised and discussed.      ____________________________________________   FINAL CLINICAL IMPRESSION(S) / ED DIAGNOSES  Final diagnoses:  Chest pain, unspecified type  Neck pain  Nonintractable headache, unspecified chronicity pattern, unspecified headache type    Medications  alum & mag hydroxide-simeth (MAALOX/MYLANTA) 200-200-20 MG/5ML suspension 15 mL (15 mLs Oral Given 03/31/21 1152)  sucralfate (CARAFATE) tablet 1 g (1 g Oral Given 03/31/21 1151)     ED Discharge Orders     None        Note:  This document was prepared using Dragon voice recognition software and may include unintentional dictation errors.    Gilles Chiquito, MD 03/31/21 1256

## 2021-03-31 NOTE — ED Triage Notes (Signed)
Pt states he had chest pain last night. He then woke up with neck pain and states his dad had a heart attack and experienced similar neck pain. Pt denies chest pain. Pt complains of neck pain of 1/10.

## 2021-10-20 ENCOUNTER — Emergency Department: Payer: Medicare PPO

## 2021-10-20 ENCOUNTER — Encounter: Payer: Self-pay | Admitting: Emergency Medicine

## 2021-10-20 ENCOUNTER — Other Ambulatory Visit: Payer: Self-pay

## 2021-10-20 ENCOUNTER — Emergency Department
Admission: EM | Admit: 2021-10-20 | Discharge: 2021-10-20 | Disposition: A | Discharge status: Home / Self Care | Payer: Medicare PPO | Attending: Emergency Medicine | Admitting: Emergency Medicine

## 2021-10-20 DIAGNOSIS — J181 Lobar pneumonia, unspecified organism: Secondary | ICD-10-CM | POA: Insufficient documentation

## 2021-10-20 DIAGNOSIS — Z20822 Contact with and (suspected) exposure to covid-19: Secondary | ICD-10-CM | POA: Insufficient documentation

## 2021-10-20 DIAGNOSIS — J189 Pneumonia, unspecified organism: Secondary | ICD-10-CM

## 2021-10-20 DIAGNOSIS — R0789 Other chest pain: Secondary | ICD-10-CM | POA: Diagnosis present

## 2021-10-20 LAB — BASIC METABOLIC PANEL
Anion gap: 7 (ref 5–15)
BUN: 18 mg/dL (ref 8–23)
CO2: 25 mmol/L (ref 22–32)
Calcium: 8.3 mg/dL — ABNORMAL LOW (ref 8.9–10.3)
Chloride: 106 mmol/L (ref 98–111)
Creatinine, Ser: 1.08 mg/dL (ref 0.61–1.24)
GFR, Estimated: >60 mL/min (ref >60)
Glucose, Bld: 173 mg/dL — ABNORMAL HIGH (ref 70–99)
Potassium: 4 mmol/L (ref 3.5–5.1)
Sodium: 138 mmol/L (ref 135–145)

## 2021-10-20 LAB — CBC
HCT: 44.2 % (ref 39.0–52.0)
Hemoglobin: 14.6 g/dL (ref 13.0–17.0)
MCH: 30 pg (ref 26.0–34.0)
MCHC: 33 g/dL (ref 30.0–36.0)
MCV: 90.8 fL (ref 80.0–100.0)
Platelets: 227 10*3/uL (ref 150–400)
RBC: 4.87 MIL/uL (ref 4.22–5.81)
RDW: 13.1 % (ref 11.5–15.5)
WBC: 13.6 10*3/uL — ABNORMAL HIGH (ref 4.0–10.5)
nRBC: 0 % (ref 0.0–0.2)

## 2021-10-20 LAB — TROPONIN I (HIGH SENSITIVITY)
Troponin I (High Sensitivity): 7 ng/L (ref <18)
Troponin I (High Sensitivity): 7 ng/L (ref <18)

## 2021-10-20 LAB — SARS CORONAVIRUS 2 BY RT PCR: SARS Coronavirus 2 by RT PCR: NEGATIVE

## 2021-10-20 MED ORDER — AZITHROMYCIN 500 MG PO TABS
500.0000 mg | ORAL_TABLET | Freq: Once | ORAL | Status: AC
  Administered 2021-10-20: 500 mg via ORAL
  Filled 2021-10-20: qty 1

## 2021-10-20 MED ORDER — AZITHROMYCIN 250 MG PO TABS: ORAL_TABLET | ORAL | 0 refills | Status: AC

## 2021-10-20 MED ORDER — CEPHALEXIN 500 MG PO CAPS: 500.0000 mg | ORAL_CAPSULE | Freq: Three times a day (TID) | ORAL | 0 refills | Status: AC

## 2021-10-20 MED ORDER — SODIUM CHLORIDE 0.9 % IV SOLN
1.0000 g | Freq: Once | INTRAVENOUS | Status: AC
  Administered 2021-10-20: 1 g via INTRAVENOUS
  Filled 2021-10-20: qty 10

## 2021-10-20 NOTE — Discharge Instructions (Signed)
I am going to give you some Keflex 3 times a day and some Zithromax.  Take both of them.  That should take care of the pneumonia.  Follow-up with your doctor in the next week or 2.  Return here if you are worse.  Worse would include a fever shortness of breath or any other problems.  Keep your appointment on Thursday with Dr. Lennette Bihari the cardiologist.  Let him know about the pneumonia.  You also have a lung nodule that showed up on the CT scan your doctor will need to follow-up on this and probably repeat a CT in 6 to 12 months to make sure it is getting smaller or at least not changing.

## 2021-10-20 NOTE — ED Notes (Signed)
Pt gives verbal consent to Dc ? ?

## 2021-10-20 NOTE — ED Triage Notes (Signed)
Pt via POV from home.  Pt c/o generalized chest pain that started this AM around 0430. States it like a pressure. States that he has a cardiac hx with stent placement. Denies SOB but states he is breathing fast. Denies cough/fever. Pt also has a hx of COPD. Pt does take blood thinners. States he took 2 Nitro and that helped. Pt is A&OX4 and NAD

## 2021-10-20 NOTE — ED Provider Notes (Signed)
[facility] Provider Note    Event Date/Time   First MD Initiated Contact with Patient 10/20/21 1231     (approximate)   History   Shortness of Breath and Chest Pain   HPI  Scott Parker is a 76 y.o. male patient feeling some chest tightness intermittently.  He has some blockages as well.  Chest tightness does not last for very long.  He is coughing up some thick phlegm he says it if he spits it will stick on what ever touches for a long time.  It does not come off.  He does not notice the color of the smell.  This has been going on for many months.  Patient tells me he had some brief lasting a second or 2 sharp stabbing pains in his chest today and last night and then he told me he had some tightness in his chest but that got better with 2 nitros that he took and he has not had any more since then.  He went walking from a car into the emergency department and did not have any pain then he said.  He has not had any pain with exertion otherwise.  And he was not exerting himself on the chest got either tightness or stab he not sure which exactly now.     Physical Exam   Triage Vital Signs: ED Triage Vitals  Enc Vitals Group     BP 10/20/21 0922 111/64     Pulse Rate 10/20/21 0922 85     Resp 10/20/21 0922 20     Temp 10/20/21 0922 98.4 F (36.9 C)     Temp Source 10/20/21 0922 Oral     SpO2 10/20/21 0922 96 %     Weight 10/20/21 0916 178 lb (80.7 kg)     Height 10/20/21 0916 5\' 10"  (1.778 m)     Head Circumference --      Peak Flow --      Pain Score 10/20/21 0913 3     Pain Loc --      Pain Edu? --      Excl. in GC? --     Most recent vital signs: Vitals:   10/20/21 1500 10/20/21 1530  BP: 129/78 132/73  Pulse: 85 79  Resp: 19 19  Temp:  98.4 F (36.9 C)  SpO2: 98% 97%     General: Awake, no distress.  CV:  Good peripheral perfusion.  Resp:  Normal effort.  Abd:  No distention.  Extremities with no edema   ED Results / Procedures  / Treatments   Labs (all labs ordered are listed, but only abnormal results are displayed) Labs Reviewed  BASIC METABOLIC PANEL - Abnormal; Notable for the following components:      Result Value   Glucose, Bld 173 (*)    Calcium 8.3 (*)    All other components within normal limits  CBC - Abnormal; Notable for the following components:   WBC 13.6 (*)    All other components within normal limits  SARS CORONAVIRUS 2 BY RT PCR  TROPONIN I (HIGH SENSITIVITY)  TROPONIN I (HIGH SENSITIVITY)     EKG  EKG read interpreted by me shows normal sinus rhythm rate of 81 normal axis no acute ST-T wave changes are seen   RADIOLOGY Chest x-ray read by radiology read and interpreted by me appears to show a multifocal pneumonia.  On the left side.  PROCEDURES:  Critical Care performed:  Procedures   MEDICATIONS ORDERED IN ED: Medications  cefTRIAXone (ROCEPHIN) 1 g in sodium chloride 0.9 % 100 mL IVPB (0 g Intravenous Stopped 10/20/21 1539)  azithromycin (ZITHROMAX) tablet 500 mg (500 mg Oral Given 10/20/21 1459)     IMPRESSION / MDM / ASSESSMENT AND PLAN / ED COURSE  I reviewed the triage vital signs and the nursing notes. Patient's troponins are negative.  Patient's history is not really consistent with cardiac pain without most of his reported pain was sharp stabbing brief episodes of pain.  They tightness he told me about Hejl been lasting apparently well over an hour.  He does have follow-up with cardiology on Thursday  Differential diagnosis includes, but is not limited to, patient does have a pneumonia.  He does have a lung nodule which will need outpatient follow-up.  He does have known coronary artery calcifications which will need follow-up.  He does not appear to have angina at the present time.  We will treat his pneumonia and have him follow-up with cardiology as planned he will return if she has any worsening of any symptoms.  Patient's presentation is most consistent with  acute presentation with potential threat to life or bodily function.  The patient is on the cardiac monitor to evaluate for evidence of arrhythmia and/or significant heart rate changes.  None were seen.    FINAL CLINICAL IMPRESSION(S) / ED DIAGNOSES   Final diagnoses:  Community acquired pneumonia of left upper lobe of lung     Rx / DC Orders   ED Discharge Orders          Ordered    cephALEXin (KEFLEX) 500 MG capsule  3 times daily        10/20/21 1437    azithromycin (ZITHROMAX Z-PAK) 250 MG tablet        10/20/21 1437             Note:  This document was prepared using Dragon voice recognition software and may include unintentional dictation errors.   Arnaldo Natal, MD 10/20/21 [phone]

## 2021-10-20 NOTE — ED Provider Triage Note (Signed)
Emergency Medicine Provider Triage Evaluation Note  Scott Parker , a 76 y.o. male  was evaluated in triage.  Pt complains of chest pain that woke his up out of his sleep.  Hx of prior MI.   Nausea without vomiting.  Scheduled for  cardiology August 10th.    Review of Systems  Positive: + MI, + stent + known blockages.  Negative: Diaphoresis, pain radiation.   Physical Exam  There were no vitals taken for this visit. Gen:   Awake, no distress  able to talk in complete sentences.   Resp:  Normal effort  MSK:   Moves extremities without difficulty  Other:    Medical Decision Making  Medically screening exam initiated at 9:13 AM.  Appropriate orders placed.  Scott Parker was informed that the remainder of the evaluation will be completed by another provider, this initial triage assessment does not replace that evaluation, and the importance of remaining in the ED until their evaluation is complete.     Tommi Rumps, PA-C 10/20/21 [phone]

## 2022-03-05 ENCOUNTER — Other Ambulatory Visit (INDEPENDENT_AMBULATORY_CARE_PROVIDER_SITE_OTHER): Payer: Self-pay | Admitting: Nurse Practitioner

## 2022-03-05 DIAGNOSIS — I6523 Occlusion and stenosis of bilateral carotid arteries: Secondary | ICD-10-CM

## 2022-03-05 DIAGNOSIS — R42 Dizziness and giddiness: Secondary | ICD-10-CM

## 2022-03-06 ENCOUNTER — Ambulatory Visit (INDEPENDENT_AMBULATORY_CARE_PROVIDER_SITE_OTHER): Payer: Medicare PPO

## 2022-03-06 DIAGNOSIS — I6523 Occlusion and stenosis of bilateral carotid arteries: Secondary | ICD-10-CM

## 2022-03-06 DIAGNOSIS — R42 Dizziness and giddiness: Secondary | ICD-10-CM

## 2022-03-10 NOTE — Progress Notes (Signed)
MRN : [id number]  Scott Parker is a 76 y.o. ([birth year]) male who presents with chief complaint of check carotid arteries.  History of Present Illness:   The patient is seen for follow up evaluation of carotid stenosis status post bilateral carotid angiography.  There were no post angiogram problems or complications related to the angiogram.  The patient is now noting left neck pain that begins in the base of the left neck and radiates down his arm.  Seems variable and kind of shoots down.    Carotid duplex dated 06/28/2021 showed <50% bilateral ICA stenosis.   The patient denies interval amaurosis fugax. There is no recent history of TIA symptoms or focal motor deficits. There is no prior documented CVA.   The patient denies headache.   The patient is taking enteric-coated aspirin 81 mg daily.   The patient has a history of coronary artery disease, no recent episodes of angina or shortness of breath. The patient denies PAD or claudication symptoms. There is a history of hyperlipidemia which is being treated with a statin.   No outpatient medications have been marked as taking for the 03/11/22 encounter (Appointment) with Gilda Crease, Latina Craver, MD.    Past Medical History:  Diagnosis Date   Arthritis    spinal column   COPD (chronic obstructive pulmonary disease) (HCC)    Coronary artery disease    Dyspnea    with exertion   Dysrhythmia    GERD (gastroesophageal reflux disease)    Gout    right foot   HOH (hard of hearing)    Hypercholesterolemia    Hyperlipemia    Hypertension    MRSA (methicillin resistant Staphylococcus aureus) 2016   HX of right lower leg   Wears dentures    full upper, partial lower    Past Surgical History:  Procedure Laterality Date   CARDIAC CATHETERIZATION  12/2019   CAROTID PTA/STENT INTERVENTION Left 06/28/2020   Procedure: CAROTID PTA/STENT INTERVENTION;  Surgeon: Renford Dills, MD;  Location: [facility];  Service:  Cardiovascular;  Laterality: Left;   CATARACT EXTRACTION W/PHACO Left 02/22/2020   Procedure: CATARACT EXTRACTION PHACO AND INTRAOCULAR LENS PLACEMENT (IOC) LEFT 4.45 00:41.2;  Surgeon: Galen Manila, MD;  Location: [facility];  Service: Ophthalmology;  Laterality: Left;   CATARACT EXTRACTION W/PHACO Right 03/14/2020   Procedure: CATARACT EXTRACTION PHACO AND INTRAOCULAR LENS PLACEMENT (IOC) RIGHT 6.06 00:50.4;  Surgeon: Galen Manila, MD;  Location: [facility];  Service: Ophthalmology;  Laterality: Right;   COLONOSCOPY     pt states he has had x 2   CORONARY STENT INTERVENTION N/A 11/03/2020   Procedure: CORONARY STENT INTERVENTION;  Surgeon: Iran Ouch, MD;  Location: [facility];  Service: Cardiovascular;  Laterality: N/A;   fusion     cervical 3-7, repair ruptured disc   JOINT REPLACEMENT Bilateral    bilateral hip replacements   LEFT HEART CATH N/A 01/06/2020   Procedure: Left Heart Cath with possible intervention;  Surgeon: Laurier Nancy, MD;  Location: [facility];  Service: Cardiovascular;  Laterality: N/A;   LEFT HEART CATH AND CORONARY ANGIOGRAPHY Right 11/03/2020   Procedure: LEFT HEART CATH AND CORONARY ANGIOGRAPHY with Possible Interventon;  Surgeon: Laurier Nancy, MD;  Location: [facility];  Service: Cardiovascular;  Laterality: Right;   TOTAL HIP ARTHROPLASTY     bilateral    Social History Social History   Tobacco Use   Smoking  status: Former    Types: Cigarettes    Quit date: 10/1995    Years since quitting: 26.3   Smokeless tobacco: Never  Vaping Use   Vaping Use: Never used  Substance Use Topics   Alcohol use: Not Currently   Drug use: Never    Family History Family History  Problem Relation Age of Onset   Hypertension Mother     No Known Allergies   REVIEW OF SYSTEMS (Negative unless checked)  Constitutional: [] Weight loss  [] Fever  [] Chills Cardiac: [] Chest pain   [] Chest pressure    [] Palpitations   [] Shortness of breath when laying flat   [] Shortness of breath with exertion. Vascular:  [x] Pain in legs with walking   [] Pain in legs at rest  [] History of DVT   [] Phlebitis   [] Swelling in legs   [] Varicose veins   [] Non-healing ulcers Pulmonary:   [] Uses home oxygen   [] Productive cough   [] Hemoptysis   [] Wheeze  [] COPD   [] Asthma Neurologic:  [] Dizziness   [] Seizures   [] History of stroke   [] History of TIA  [] Aphasia   [] Vissual changes   [] Weakness or numbness in arm   [] Weakness or numbness in leg Musculoskeletal:   [] Joint swelling   [] Joint pain   [] Low back pain Hematologic:  [] Easy bruising  [] Easy bleeding   [] Hypercoagulable state   [] Anemic Gastrointestinal:  [] Diarrhea   [] Vomiting  [x] Gastroesophageal reflux/heartburn   [] Difficulty swallowing. Genitourinary:  [] Chronic kidney disease   [] Difficult urination  [] Frequent urination   [] Blood in urine Skin:  [] Rashes   [] Ulcers  Psychological:  [] History of anxiety   []  History of major depression.  Physical Examination  There were no vitals filed for this visit. There is no height or weight on file to calculate BMI. Gen: WD/WN, NAD Head: [place]/AT, No temporalis wasting.  Ear/Nose/Throat: Hearing grossly intact, nares w/o erythema or drainage Eyes: PER, EOMI, sclera nonicteric.  Neck: Supple, no masses.  No bruit or JVD.  Pulmonary:  Good air movement, no audible wheezing, no use of accessory muscles.  Cardiac: RRR, normal S1, S2, no Murmurs. Vascular:  carotid bruit noted Vessel Right Left  Radial Palpable Palpable  Carotid  Palpable  Palpable  Subclav  Palpable Palpable  Gastrointestinal: soft, non-distended. No guarding/no peritoneal signs.  Musculoskeletal: M/S 5/5 throughout.  No visible deformity.  Neurologic: CN 2-12 intact. Pain and light touch intact in extremities.  Symmetrical.  Speech is fluent. Motor exam as listed above. Psychiatric: Judgment intact, Mood & affect appropriate for pt's clinical  situation. Dermatologic: No rashes or ulcers noted.  No changes consistent with cellulitis.   CBC Lab Results  Component Value Date   WBC 13.6 (H) 10/20/2021   HGB 14.6 10/20/2021   HCT 44.2 10/20/2021   MCV 90.8 10/20/2021   PLT 227 10/20/2021    BMET    Component Value Date/Time   NA 138 10/20/2021 0917   K 4.0 10/20/2021 0917   CL 106 10/20/2021 0917   CO2 25 10/20/2021 0917   GLUCOSE 173 (H) 10/20/2021 0917   BUN 18 10/20/2021 0917   CREATININE 1.08 10/20/2021 0917   CALCIUM 8.3 (L) 10/20/2021 0917   GFRNONAA >60 10/20/2021 0917   GFRAA >60 12/06/2019 1848   CrCl cannot be calculated (Patient's most recent lab result is older than the maximum 21 days allowed.).  COAG No results found for: "INR", "PROTIME"  Radiology No results found.   Assessment/Plan 1. 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  I believe the pain in the base of his neck radiating down his arm is radicular and secondary to DJD of the C-spine.  I have asked him to follow up with his primary.  Carotid duplex dated 06/28/2021 showed <50% bilateral ICA stenosis.  This correlated with the angiogram.  Continue antiplatelet therapy as prescribed Continue management of CAD, HTN and Hyperlipidemia Healthy heart diet,  encouraged exercise at least 4 times per week Follow up in 4 months with duplex ultrasound and physical exam   2. Coronary artery disease of native artery of native heart with stable angina pectoris (HCC) Continue cardiac and antihypertensive medications as already ordered and reviewed, no changes at this time.  Continue statin as ordered and reviewed, no changes at this time  Nitrates PRN for chest pain  3. Essential hypertension Continue antihypertensive medications as already ordered, these medications have been reviewed and there are no changes at this  time.  4. Mixed hyperlipidemia Continue statin as ordered and reviewed, no changes at this time    Levora Dredge, MD  03/10/2022 3:55 PM

## 2022-03-11 ENCOUNTER — Ambulatory Visit (INDEPENDENT_AMBULATORY_CARE_PROVIDER_SITE_OTHER): Payer: Medicare PPO | Admitting: Vascular Surgery

## 2022-03-11 ENCOUNTER — Encounter (INDEPENDENT_AMBULATORY_CARE_PROVIDER_SITE_OTHER): Payer: Self-pay | Admitting: Vascular Surgery

## 2022-03-11 VITALS — BP 144/83 | HR 80 | Resp 16 | Ht 70.0 in | Wt 190.0 lb

## 2022-03-11 DIAGNOSIS — I6523 Occlusion and stenosis of bilateral carotid arteries: Secondary | ICD-10-CM | POA: Diagnosis not present

## 2022-03-11 DIAGNOSIS — I10 Essential (primary) hypertension: Secondary | ICD-10-CM

## 2022-03-11 DIAGNOSIS — E782 Mixed hyperlipidemia: Secondary | ICD-10-CM

## 2022-03-11 DIAGNOSIS — I25118 Atherosclerotic heart disease of native coronary artery with other forms of angina pectoris: Secondary | ICD-10-CM | POA: Diagnosis not present

## 2022-03-17 ENCOUNTER — Encounter (INDEPENDENT_AMBULATORY_CARE_PROVIDER_SITE_OTHER): Payer: Self-pay | Admitting: Vascular Surgery

## 2022-03-18 ENCOUNTER — Encounter: Payer: Self-pay | Admitting: Emergency Medicine

## 2022-03-18 ENCOUNTER — Other Ambulatory Visit: Payer: Self-pay

## 2022-03-18 ENCOUNTER — Emergency Department: Payer: Medicare PPO

## 2022-03-18 ENCOUNTER — Emergency Department
Admission: EM | Admit: 2022-03-18 | Discharge: 2022-03-18 | Disposition: A | Discharge status: Home / Self Care | Payer: Medicare PPO | Attending: Student in an Organized Health Care Education/Training Program | Admitting: Student in an Organized Health Care Education/Training Program

## 2022-03-18 ENCOUNTER — Ambulatory Visit: Payer: Self-pay | Admitting: Cardiovascular Disease

## 2022-03-18 DIAGNOSIS — R0789 Other chest pain: Secondary | ICD-10-CM | POA: Diagnosis present

## 2022-03-18 DIAGNOSIS — I2510 Atherosclerotic heart disease of native coronary artery without angina pectoris: Secondary | ICD-10-CM | POA: Insufficient documentation

## 2022-03-18 LAB — BASIC METABOLIC PANEL
Anion gap: 8 (ref 5–15)
BUN: 19 mg/dL (ref 8–23)
CO2: 28 mmol/L (ref 22–32)
Calcium: 9.3 mg/dL (ref 8.9–10.3)
Chloride: 103 mmol/L (ref 98–111)
Creatinine, Ser: 1 mg/dL (ref 0.61–1.24)
GFR, Estimated: >60 mL/min (ref >60)
Glucose, Bld: 144 mg/dL — ABNORMAL HIGH (ref 70–99)
Potassium: 3.7 mmol/L (ref 3.5–5.1)
Sodium: 139 mmol/L (ref 135–145)

## 2022-03-18 LAB — CBC
HCT: 45.2 % (ref 39.0–52.0)
Hemoglobin: 15 g/dL (ref 13.0–17.0)
MCH: 30.9 pg (ref 26.0–34.0)
MCHC: 33.2 g/dL (ref 30.0–36.0)
MCV: 93 fL (ref 80.0–100.0)
Platelets: 238 10*3/uL (ref 150–400)
RBC: 4.86 MIL/uL (ref 4.22–5.81)
RDW: 12.9 % (ref 11.5–15.5)
WBC: 8.5 10*3/uL (ref 4.0–10.5)
nRBC: 0 % (ref 0.0–0.2)

## 2022-03-18 LAB — TROPONIN I (HIGH SENSITIVITY)
Troponin I (High Sensitivity): 8 ng/L (ref <18)
Troponin I (High Sensitivity): 7 ng/L (ref <18)

## 2022-03-18 MED ORDER — SODIUM CHLORIDE 0.9% FLUSH: 3.0000 mL | Freq: Two times a day (BID) | INTRAVENOUS | Status: DC

## 2022-03-18 MED ORDER — ALUM & MAG HYDROXIDE-SIMETH 200-200-20 MG/5ML PO SUSP
30.0000 mL | Freq: Once | ORAL | Status: AC
  Administered 2022-03-18: 30 mL via ORAL
  Filled 2022-03-18: qty 30

## 2022-03-18 NOTE — ED Triage Notes (Signed)
Pt to ED via POV, states started with generalized CP at approx 0200 this morning, resolved on it's own, then restarted again at 0400, and once again resolved on it's own. Pt states cardiac hx, states had a cardiac stent placed in August of last year.

## 2022-03-18 NOTE — ED Notes (Signed)
See triage note  Presents some chest pressure  States he had 2 episodes   did take Tums and NTG w/o relief

## 2022-03-18 NOTE — ED Provider Notes (Signed)
[facility] Provider Note    Event Date/Time   First MD Initiated Contact with Patient 03/18/22 0719     (approximate)   History   Chest Pain   HPI  Scott Parker is a 76 y.o. male history of CAD status post stent presents to the ER for evaluation of midsternal nonradiating burning chest discomfort.  States he had some symptoms last night took a Tums with some improvement.  Then woke up early this morning with similar pain.  Did not have any change with nitro.  Took Rolaids with some improvement but then it came back.  He denies any abdominal pain.  Denies any shortness of breath.  No fevers or congestion.  Denies any diaphoresis.  No pain radiating through to his back or shoulders or arms.     Physical Exam   Triage Vital Signs: ED Triage Vitals  Enc Vitals Group     BP 03/18/22 0533 (!) 166/94     Pulse Rate 03/18/22 0533 80     Resp 03/18/22 0533 17     Temp 03/18/22 0533 97.7 F (36.5 C)     Temp Source 03/18/22 0533 Oral     SpO2 03/18/22 0533 98 %     Weight 03/18/22 0531 190 lb (86.2 kg)     Height 03/18/22 0531 5\' 10"  (1.778 m)     Head Circumference --      Peak Flow --      Pain Score 03/18/22 0531 5     Pain Loc --      Pain Edu? --      Excl. in GC? --     Most recent vital signs: Vitals:   03/18/22 0533  BP: (!) 166/94  Pulse: 80  Resp: 17  Temp: 97.7 F (36.5 C)  SpO2: 98%     Constitutional: Alert  Eyes: Conjunctivae are normal.  Head: Atraumatic. Nose: No congestion/rhinnorhea. Mouth/Throat: Mucous membranes are moist.   Neck: Painless ROM.  Cardiovascular:   Good peripheral circulation. Respiratory: Normal respiratory effort.  No retractions.  Gastrointestinal: Soft and nontender.  Musculoskeletal:  no deformity Neurologic:  MAE spontaneously. No gross focal neurologic deficits are appreciated.  Skin:  Skin is warm, dry and intact. No rash noted. Psychiatric: Mood and affect are normal. Speech and behavior  are normal.    ED Results / Procedures / Treatments   Labs (all labs ordered are listed, but only abnormal results are displayed) Labs Reviewed  BASIC METABOLIC PANEL - Abnormal; Notable for the following components:      Result Value   Glucose, Bld 144 (*)    All other components within normal limits  CBC  TROPONIN I (HIGH SENSITIVITY)  TROPONIN I (HIGH SENSITIVITY)     EKG  ED ECG REPORT I, 03/20/22, the attending physician, personally viewed and interpreted this ECG.   Date: 03/18/2022  EKG Time: 5:36  Rate: 80  Rhythm: sinus  Axis: normal  Intervals: normal  ST&T Change: no stemi, no depressions    RADIOLOGY Please see ED Course for my review and interpretation.  I personally reviewed all radiographic images ordered to evaluate for the above acute complaints and reviewed radiology reports and findings.  These findings were personally discussed with the patient.  Please see medical record for radiology report.    PROCEDURES:  Critical Care performed:   Procedures   MEDICATIONS ORDERED IN ED: Medications  alum & mag hydroxide-simeth (MAALOX/MYLANTA) 200-200-20 MG/5ML suspension 30 mL (  30 mLs Oral Given 03/18/22 0811)     IMPRESSION / MDM / ASSESSMENT AND PLAN / ED COURSE  I reviewed the triage vital signs and the nursing notes.                              Differential diagnosis includes, but is not limited to, ACS, pericarditis, esophagitis, boerhaaves, pe, dissection, pna, bronchitis, costochondritis  Patient presenting to the ER for evaluation of symptoms as described above.  Based on symptoms, risk factors and considered above differential, this presenting complaint could reflect a potentially life-threatening illness therefore the patient will be placed on continuous pulse oximetry and telemetry for monitoring.  Laboratory evaluation will be sent to evaluate for the above complaints.  Symptoms seem atypical for ACS but given patient's age  risk factors history will observe for serial enzymes.  Initial troponin is negative.  This does not seem consistent with PE or dissection.  He does not have any pleuritic pain.  Doubt pericarditis.  Does not have any significant wheezing or respiratory symptoms.  Chest x-ray on my review and interpretation without evidence of consolidation or pneumothorax.  Abd exam is soft and benign.   Clinical Course as of 03/18/22 0901  Mon Mar 18, 2022  0856 Patient reassessed.  Remains well-appearing.  Troponin negative.  Based on his history I discussed case in consultation with Dr. Chancy Milroy of cardiology who has recommended patient come over to his office for evaluation.  Not felt to require hospitalization at this time.  Patient agreeable with plan. [PR]    Clinical Course User Index [PR] Merlyn Lot, MD    FINAL CLINICAL IMPRESSION(S) / ED DIAGNOSES   Final diagnoses:  Atypical chest pain     Rx / DC Orders   ED Discharge Orders     None        Note:  This document was prepared using Dragon voice recognition software and may include unintentional dictation errors.    Merlyn Lot, MD 03/18/22 [phone]

## 2022-03-19 ENCOUNTER — Ambulatory Visit
Admission: RE | Admit: 2022-03-19 | Discharge: 2022-03-19 | Disposition: A | Discharge status: Home / Self Care | Payer: Medicare PPO | Source: Ambulatory Visit | Attending: Cardiovascular Disease | Admitting: Cardiovascular Disease

## 2022-03-19 ENCOUNTER — Other Ambulatory Visit: Payer: Self-pay

## 2022-03-19 ENCOUNTER — Encounter: Payer: Self-pay | Admitting: Cardiovascular Disease

## 2022-03-19 ENCOUNTER — Encounter
Admission: RE | Disposition: A | Discharge status: Home / Self Care | Payer: Self-pay | Source: Ambulatory Visit | Attending: Cardiovascular Disease

## 2022-03-19 DIAGNOSIS — R079 Chest pain, unspecified: Secondary | ICD-10-CM | POA: Diagnosis present

## 2022-03-19 DIAGNOSIS — Z955 Presence of coronary angioplasty implant and graft: Secondary | ICD-10-CM | POA: Diagnosis not present

## 2022-03-19 DIAGNOSIS — I2510 Atherosclerotic heart disease of native coronary artery without angina pectoris: Secondary | ICD-10-CM | POA: Insufficient documentation

## 2022-03-19 HISTORY — PX: LEFT HEART CATH AND CORONARY ANGIOGRAPHY: CATH118249

## 2022-03-19 SURGERY — LEFT HEART CATH AND CORONARY ANGIOGRAPHY
Anesthesia: Moderate Sedation

## 2022-03-19 MED ORDER — HEPARIN SODIUM (PORCINE) 1000 UNIT/ML IJ SOLN
INTRAMUSCULAR | Status: AC
  Filled 2022-03-19: qty 10

## 2022-03-19 MED ORDER — SODIUM CHLORIDE 0.9 % IV SOLN: 250.0000 mL | INTRAVENOUS | Status: DC | PRN

## 2022-03-19 MED ORDER — SODIUM CHLORIDE 0.9% FLUSH: 3.0000 mL | Freq: Two times a day (BID) | INTRAVENOUS | Status: DC

## 2022-03-19 MED ORDER — SODIUM CHLORIDE 0.9 % WEIGHT BASED INFUSION: 1.0000 mL/kg/h | INTRAVENOUS | Status: DC

## 2022-03-19 MED ORDER — SODIUM CHLORIDE 0.9% FLUSH: 3.0000 mL | INTRAVENOUS | Status: DC | PRN

## 2022-03-19 MED ORDER — FENTANYL CITRATE (PF) 100 MCG/2ML IJ SOLN
INTRAMUSCULAR | Status: AC
  Filled 2022-03-19: qty 2

## 2022-03-19 MED ORDER — HEPARIN (PORCINE) IN NACL 1000-0.9 UT/500ML-% IV SOLN
INTRAVENOUS | Status: DC | PRN
  Administered 2022-03-19 (×2): 500 mL

## 2022-03-19 MED ORDER — HEPARIN (PORCINE) IN NACL 1000-0.9 UT/500ML-% IV SOLN
INTRAVENOUS | Status: AC
  Filled 2022-03-19: qty 1000

## 2022-03-19 MED ORDER — MIDAZOLAM HCL 2 MG/2ML IJ SOLN
INTRAMUSCULAR | Status: DC | PRN
  Administered 2022-03-19: 1 mg via INTRAVENOUS

## 2022-03-19 MED ORDER — LABETALOL HCL 5 MG/ML IV SOLN: 10.0000 mg | INTRAVENOUS | Status: DC | PRN

## 2022-03-19 MED ORDER — SODIUM CHLORIDE 0.9 % WEIGHT BASED INFUSION
1.0000 mL/kg/h | INTRAVENOUS | Status: DC
  Administered 2022-03-19: 1 mL/kg/h via INTRAVENOUS

## 2022-03-19 MED ORDER — ACETAMINOPHEN 325 MG PO TABS: 650.0000 mg | ORAL_TABLET | ORAL | Status: DC | PRN

## 2022-03-19 MED ORDER — ONDANSETRON HCL 4 MG/2ML IJ SOLN: 4.0000 mg | Freq: Four times a day (QID) | INTRAMUSCULAR | Status: DC | PRN

## 2022-03-19 MED ORDER — IOHEXOL 300 MG/ML  SOLN
INTRAMUSCULAR | Status: DC | PRN
  Administered 2022-03-19: 70 mL

## 2022-03-19 MED ORDER — MIDAZOLAM HCL 2 MG/2ML IJ SOLN
INTRAMUSCULAR | Status: AC
  Filled 2022-03-19: qty 2

## 2022-03-19 MED ORDER — SODIUM CHLORIDE 0.9 % WEIGHT BASED INFUSION
3.0000 mL/kg/h | INTRAVENOUS | Status: DC
  Administered 2022-03-19: 3 mL/kg/h via INTRAVENOUS

## 2022-03-19 MED ORDER — FENTANYL CITRATE (PF) 100 MCG/2ML IJ SOLN
INTRAMUSCULAR | Status: DC | PRN
  Administered 2022-03-19: 25 ug via INTRAVENOUS

## 2022-03-19 MED ORDER — ASPIRIN 81 MG PO CHEW: 81.0000 mg | CHEWABLE_TABLET | ORAL | Status: AC

## 2022-03-19 MED ORDER — ASPIRIN 81 MG PO CHEW
CHEWABLE_TABLET | ORAL | Status: AC
  Administered 2022-03-19: 81 mg via ORAL
  Filled 2022-03-19: qty 1

## 2022-03-19 MED ORDER — HYDRALAZINE HCL 20 MG/ML IJ SOLN: 10.0000 mg | INTRAMUSCULAR | Status: DC | PRN

## 2022-03-19 SURGICAL SUPPLY — 11 items
CATH INFINITI 5 FR 3DRC (CATHETERS) IMPLANT
CATH INFINITI 5FR MULTPACK ANG (CATHETERS) IMPLANT
DEVICE CLOSURE MYNXGRIP 5F (Vascular Products) IMPLANT
NDL PERC 18GX7CM (NEEDLE) IMPLANT
NEEDLE PERC 18GX7CM (NEEDLE) ×1 IMPLANT
PACK CARDIAC CATH (CUSTOM PROCEDURE TRAY) ×1 IMPLANT
PROTECTION STATION PRESSURIZED (MISCELLANEOUS) ×1
SET ATX SIMPLICITY (MISCELLANEOUS) IMPLANT
SHEATH AVANTI 5FR X 11CM (SHEATH) IMPLANT
STATION PROTECTION PRESSURIZED (MISCELLANEOUS) IMPLANT
WIRE GUIDERIGHT .035X150 (WIRE) IMPLANT

## 2022-05-13 ENCOUNTER — Ambulatory Visit: Payer: Medicare PPO | Admitting: Cardiovascular Disease

## 2022-05-22 ENCOUNTER — Other Ambulatory Visit: Payer: Self-pay

## 2022-05-23 MED ORDER — ATORVASTATIN CALCIUM 80 MG PO TABS: 80.0000 mg | ORAL_TABLET | Freq: Every evening | ORAL | 0 refills | Status: AC

## 2022-06-02 NOTE — Progress Notes (Unsigned)
MRN : [id number]  Scott Parker is a 77 y.o. ([birth year]) male who presents with chief complaint of check carotid arteries.  History of Present Illness: ***  No outpatient medications have been marked as taking for the 06/03/22 encounter (Appointment) with Delana Meyer, Dolores Lory, MD.    Past Medical History:  Diagnosis Date   Arthritis    spinal column   COPD (chronic obstructive pulmonary disease) ([place])    Coronary artery disease    Dyspnea    with exertion   Dysrhythmia    GERD (gastroesophageal reflux disease)    Gout    right foot   HOH (hard of hearing)    Hypercholesterolemia    Hyperlipemia    Hypertension    MRSA (methicillin resistant Staphylococcus aureus) 2016   HX of right lower leg   Wears dentures    full upper, partial lower    Past Surgical History:  Procedure Laterality Date   CARDIAC CATHETERIZATION  12/2019   CAROTID PTA/STENT INTERVENTION Left 06/28/2020   Procedure: CAROTID PTA/STENT INTERVENTION;  Surgeon: Katha Cabal, MD;  Location: [place] [facility];  Service: Cardiovascular;  Laterality: Left;   CATARACT EXTRACTION W/PHACO Left 02/22/2020   Procedure: CATARACT EXTRACTION PHACO AND INTRAOCULAR LENS PLACEMENT (IOC) LEFT 4.45 00:41.2;  Surgeon: Birder Robson, MD;  Location: [place];  Service: Ophthalmology;  Laterality: Left;   CATARACT EXTRACTION W/PHACO Right 03/14/2020   Procedure: CATARACT EXTRACTION PHACO AND INTRAOCULAR LENS PLACEMENT (IOC) RIGHT 6.06 00:50.4;  Surgeon: Birder Robson, MD;  Location: [place];  Service: Ophthalmology;  Laterality: Right;   COLONOSCOPY     pt states he has had x 2   CORONARY STENT INTERVENTION N/A 11/03/2020   Procedure: CORONARY STENT INTERVENTION;  Surgeon: Wellington Hampshire, MD;  Location: [place] [facility];  Service: Cardiovascular;  Laterality: N/A;   fusion     cervical 3-7, repair ruptured disc   JOINT REPLACEMENT Bilateral    bilateral hip replacements    LEFT HEART CATH N/A 01/06/2020   Procedure: Left Heart Cath with possible intervention;  Surgeon: Dionisio David, MD;  Location: [place] [facility];  Service: Cardiovascular;  Laterality: N/A;   LEFT HEART CATH AND CORONARY ANGIOGRAPHY Right 11/03/2020   Procedure: LEFT HEART CATH AND CORONARY ANGIOGRAPHY with Possible Interventon;  Surgeon: Dionisio David, MD;  Location: [place] [facility];  Service: Cardiovascular;  Laterality: Right;   LEFT HEART CATH AND CORONARY ANGIOGRAPHY N/A 03/19/2022   Procedure: LEFT HEART CATH AND CORONARY ANGIOGRAPHY;  Surgeon: Dionisio David, MD;  Location: [place] [facility];  Service: Cardiovascular;  Laterality: N/A;   TOTAL HIP ARTHROPLASTY     bilateral    Social History Social History   Tobacco Use   Smoking status: Former    Types: Cigarettes    Quit date: 10/1995    Years since quitting: 26.6   Smokeless tobacco: Never  Vaping Use   Vaping Use: Never used  Substance Use Topics   Alcohol use: Yes    Alcohol/week: 12.0 standard drinks of alcohol    Types: 12 Cans of beer per week   Drug use: Never    Family History Family History  Problem Relation Age of Onset   Hypertension Mother     No Known Allergies   REVIEW OF SYSTEMS (Negative unless checked)  Constitutional: '[]'$ Weight loss  '[]'$ Fever  '[]'$ Chills Cardiac: '[]'$ Chest pain   '[]'$ Chest pressure   '[]'$ Palpitations   '[]'$ Shortness  of breath when laying flat   '[]'$ Shortness of breath with exertion. Vascular:  '[x]'$ Pain in legs with walking   '[]'$ Pain in legs at rest  '[]'$ History of DVT   '[]'$ Phlebitis   '[]'$ Swelling in legs   '[]'$ Varicose veins   '[]'$ Non-healing ulcers Pulmonary:   '[]'$ Uses home oxygen   '[]'$ Productive cough   '[]'$ Hemoptysis   '[]'$ Wheeze  '[]'$ COPD   '[]'$ Asthma Neurologic:  '[]'$ Dizziness   '[]'$ Seizures   '[]'$ History of stroke   '[]'$ History of TIA  '[]'$ Aphasia   '[]'$ Vissual changes   '[]'$ Weakness or numbness in arm   '[]'$ Weakness or numbness in leg Musculoskeletal:   '[]'$ Joint swelling   '[]'$ Joint pain   '[]'$ Low back  pain Hematologic:  '[]'$ Easy bruising  '[]'$ Easy bleeding   '[]'$ Hypercoagulable state   '[]'$ Anemic Gastrointestinal:  '[]'$ Diarrhea   '[]'$ Vomiting  '[x]'$ Gastroesophageal reflux/heartburn   '[]'$ Difficulty swallowing. Genitourinary:  '[]'$ Chronic kidney disease   '[]'$ Difficult urination  '[]'$ Frequent urination   '[]'$ Blood in urine Skin:  '[]'$ Rashes   '[]'$ Ulcers  Psychological:  '[]'$ History of anxiety   '[]'$  History of major depression.  Physical Examination  There were no vitals filed for this visit. There is no height or weight on file to calculate BMI. Gen: WD/WN, NAD Head: [place]/AT, No temporalis wasting.  Ear/Nose/Throat: Hearing grossly intact, nares w/o erythema or drainage Eyes: PER, EOMI, sclera nonicteric.  Neck: Supple, no masses.  No bruit or JVD.  Pulmonary:  Good air movement, no audible wheezing, no use of accessory muscles.  Cardiac: RRR, normal S1, S2, no Murmurs. Vascular:  carotid bruit noted Vessel Right Left  Radial Palpable Palpable  Carotid  Palpable  Palpable  Subclav  Palpable Palpable  Gastrointestinal: soft, non-distended. No guarding/no peritoneal signs.  Musculoskeletal: M/S 5/5 throughout.  No visible deformity.  Neurologic: CN 2-12 intact. Pain and light touch intact in extremities.  Symmetrical.  Speech is fluent. Motor exam as listed above. Psychiatric: Judgment intact, Mood & affect appropriate for pt's clinical situation. Dermatologic: No rashes or ulcers noted.  No changes consistent with cellulitis.   CBC Lab Results  Component Value Date   WBC 8.5 03/18/2022   HGB 15.0 03/18/2022   HCT 45.2 03/18/2022   MCV 93.0 03/18/2022   PLT 238 03/18/2022    BMET    Component Value Date/Time   NA 139 03/18/2022 0534   K 3.7 03/18/2022 0534   CL 103 03/18/2022 0534   CO2 28 03/18/2022 0534   GLUCOSE 144 (H) 03/18/2022 0534   BUN 19 03/18/2022 0534   CREATININE 1.00 03/18/2022 0534   CALCIUM 9.3 03/18/2022 0534   GFRNONAA >60 03/18/2022 0534   GFRAA >60 12/06/2019 1848   CrCl  cannot be calculated (Patient's most recent lab result is older than the maximum 21 days allowed.).  COAG No results found for: "INR", "PROTIME"  Radiology No results found.   Assessment/Plan There are no diagnoses linked to this encounter.   Hortencia Pilar, MD  06/02/2022 9:10 PM

## 2022-06-03 ENCOUNTER — Ambulatory Visit (INDEPENDENT_AMBULATORY_CARE_PROVIDER_SITE_OTHER): Payer: Medicare PPO | Admitting: Vascular Surgery

## 2022-06-03 ENCOUNTER — Ambulatory Visit (INDEPENDENT_AMBULATORY_CARE_PROVIDER_SITE_OTHER): Payer: Medicare PPO

## 2022-06-03 VITALS — BP 119/73 | HR 62 | Resp 18 | Ht 70.0 in | Wt 190.0 lb

## 2022-06-03 DIAGNOSIS — I6523 Occlusion and stenosis of bilateral carotid arteries: Secondary | ICD-10-CM | POA: Diagnosis not present

## 2022-06-03 DIAGNOSIS — K219 Gastro-esophageal reflux disease without esophagitis: Secondary | ICD-10-CM

## 2022-06-03 DIAGNOSIS — I25118 Atherosclerotic heart disease of native coronary artery with other forms of angina pectoris: Secondary | ICD-10-CM | POA: Diagnosis not present

## 2022-06-03 DIAGNOSIS — E782 Mixed hyperlipidemia: Secondary | ICD-10-CM

## 2022-06-03 DIAGNOSIS — I10 Essential (primary) hypertension: Secondary | ICD-10-CM | POA: Diagnosis not present

## 2022-06-06 ENCOUNTER — Encounter (INDEPENDENT_AMBULATORY_CARE_PROVIDER_SITE_OTHER): Payer: Self-pay | Admitting: Vascular Surgery

## 2022-06-07 ENCOUNTER — Other Ambulatory Visit: Payer: Self-pay

## 2022-06-07 MED ORDER — ISOSORBIDE MONONITRATE ER 60 MG PO TB24: 60.0000 mg | ORAL_TABLET | Freq: Every evening | ORAL | 3 refills | Status: DC

## 2022-06-17 ENCOUNTER — Ambulatory Visit: Payer: Medicare PPO | Admitting: Cardiovascular Disease

## 2022-06-17 ENCOUNTER — Encounter: Payer: Self-pay | Admitting: Cardiovascular Disease

## 2022-06-17 VITALS — BP 122/58 | HR 81 | Ht 70.0 in | Wt 187.0 lb

## 2022-06-17 DIAGNOSIS — I2510 Atherosclerotic heart disease of native coronary artery without angina pectoris: Secondary | ICD-10-CM

## 2022-06-17 DIAGNOSIS — I6523 Occlusion and stenosis of bilateral carotid arteries: Secondary | ICD-10-CM

## 2022-06-17 DIAGNOSIS — R0789 Other chest pain: Secondary | ICD-10-CM

## 2022-06-17 DIAGNOSIS — K2100 Gastro-esophageal reflux disease with esophagitis, without bleeding: Secondary | ICD-10-CM | POA: Diagnosis not present

## 2022-06-17 DIAGNOSIS — I10 Essential (primary) hypertension: Secondary | ICD-10-CM

## 2022-06-17 DIAGNOSIS — R072 Precordial pain: Secondary | ICD-10-CM

## 2022-06-17 MED ORDER — RANOLAZINE ER 500 MG PO TB12: 500.0000 mg | ORAL_TABLET | Freq: Two times a day (BID) | ORAL | 2 refills | Status: DC

## 2022-06-17 MED ORDER — ISOSORBIDE MONONITRATE ER 60 MG PO TB24: 60.0000 mg | ORAL_TABLET | Freq: Every evening | ORAL | 3 refills | Status: DC

## 2022-06-17 NOTE — Patient Instructions (Addendum)
Take metoprolol 50 night before and 50 mg 90 minutes prior to cta cornaries.

## 2022-06-17 NOTE — Progress Notes (Signed)
Cardiology Office Note   Date:  06/17/2022   ID:  Scott Parker, DOB July 23, 1945, MRN [id number]  PCP:  System, Provider Not In  Cardiologist:  Neoma Laming, MD      History of Present Illness: Scott Parker is a 77 y.o. male who presents for  Chief Complaint  Patient presents with   Follow-up    Follow Up    Tums helped with GERD type chest pain  Chest Pain  This is a new problem. The current episode started 1 to 4 weeks ago. The pain is present in the epigastric region. The quality of the pain is described as burning.      Past Medical History:  Diagnosis Date   Arthritis    spinal column   COPD (chronic obstructive pulmonary disease) (HCC)    Coronary artery disease    Dyspnea    with exertion   Dysrhythmia    GERD (gastroesophageal reflux disease)    Gout    right foot   HOH (hard of hearing)    Hypercholesterolemia    Hyperlipemia    Hypertension    MRSA (methicillin resistant Staphylococcus aureus) 2016   HX of right lower leg   Wears dentures    full upper, partial lower     Past Surgical History:  Procedure Laterality Date   CARDIAC CATHETERIZATION  12/2019   CAROTID PTA/STENT INTERVENTION Left 06/28/2020   Procedure: CAROTID PTA/STENT INTERVENTION;  Surgeon: Katha Cabal, MD;  Location: [facility];  Service: Cardiovascular;  Laterality: Left;   CATARACT EXTRACTION W/PHACO Left 02/22/2020   Procedure: CATARACT EXTRACTION PHACO AND INTRAOCULAR LENS PLACEMENT (IOC) LEFT 4.45 00:41.2;  Surgeon: Birder Robson, MD;  Location: [facility];  Service: Ophthalmology;  Laterality: Left;   CATARACT EXTRACTION W/PHACO Right 03/14/2020   Procedure: CATARACT EXTRACTION PHACO AND INTRAOCULAR LENS PLACEMENT (IOC) RIGHT 6.06 00:50.4;  Surgeon: Birder Robson, MD;  Location: [facility];  Service: Ophthalmology;  Laterality: Right;   COLONOSCOPY     pt states he has had x 2   CORONARY STENT INTERVENTION N/A 11/03/2020    Procedure: CORONARY STENT INTERVENTION;  Surgeon: Wellington Hampshire, MD;  Location: [facility];  Service: Cardiovascular;  Laterality: N/A;   fusion     cervical 3-7, repair ruptured disc   JOINT REPLACEMENT Bilateral    bilateral hip replacements   LEFT HEART CATH N/A 01/06/2020   Procedure: Left Heart Cath with possible intervention;  Surgeon: Dionisio David, MD;  Location: [facility];  Service: Cardiovascular;  Laterality: N/A;   LEFT HEART CATH AND CORONARY ANGIOGRAPHY Right 11/03/2020   Procedure: LEFT HEART CATH AND CORONARY ANGIOGRAPHY with Possible Interventon;  Surgeon: Dionisio David, MD;  Location: [facility];  Service: Cardiovascular;  Laterality: Right;   LEFT HEART CATH AND CORONARY ANGIOGRAPHY N/A 03/19/2022   Procedure: LEFT HEART CATH AND CORONARY ANGIOGRAPHY;  Surgeon: Dionisio David, MD;  Location: [facility];  Service: Cardiovascular;  Laterality: N/A;   TOTAL HIP ARTHROPLASTY     bilateral     Current Outpatient Medications  Medication Sig Dispense Refill   acetaminophen (TYLENOL) 325 MG tablet Take 650 mg by mouth 2 (two) times daily.     albuterol (VENTOLIN HFA) 108 (90 Base) MCG/ACT inhaler Inhale 1-2 puffs into the lungs every 6 (six) hours as needed for wheezing or shortness of breath.     aspirin EC 81 MG tablet Take  81 mg by mouth daily. Swallow whole.     atorvastatin (LIPITOR) 80 MG tablet Take 1 tablet (80 mg total) by mouth every evening. 90 tablet 0   budesonide-formoterol (SYMBICORT) 160-4.5 MCG/ACT inhaler Inhale 1 puff into the lungs daily.     calcium carbonate (TUMS - DOSED IN MG ELEMENTAL CALCIUM) 500 MG chewable tablet Chew 500 mg by mouth daily as needed for indigestion or heartburn.     Calcium Citrate (CITRACAL PO) Take 1 tablet by mouth daily.     CINNAMON PO Take 1,000 mg by mouth daily.     clopidogrel (PLAVIX) 75 MG tablet Take 1 tablet (75 mg total) by mouth daily. 30 tablet 11   GARLIC PO Take 1 tablet  by mouth daily.     hydrochlorothiazide (HYDRODIURIL) 25 MG tablet Take 25 mg by mouth daily.     isosorbide mononitrate (IMDUR) 60 MG 24 hr tablet Take 1 tablet (60 mg total) by mouth every evening. 90 tablet 3   metoprolol tartrate (LOPRESSOR) 50 MG tablet Take 25 mg by mouth 1 day or 1 dose.     Omega-3 Fatty Acids (FISH OIL) 1200 MG CAPS Take 1,200 mg by mouth daily.     pantoprazole (PROTONIX) 40 MG tablet Take 1 tablet (40 mg total) by mouth daily. 30 tablet 11   ranolazine (RANEXA) 500 MG 12 hr tablet Take 1 tablet (500 mg total) by mouth 2 (two) times daily. 60 tablet 2   zinc gluconate 50 MG tablet Take 50 mg by mouth 2 (two) times daily.     No current facility-administered medications for this visit.   Facility-Administered Medications Ordered in Other Visits  Medication Dose Route Frequency Provider Last Rate Last Admin   sodium chloride flush (NS) 0.9 % injection 3 mL  3 mL Intravenous Q12H Neoma Laming A, MD       sodium chloride flush (NS) 0.9 % injection 3 mL  3 mL Intravenous Q12H Neoma Laming A, MD       sodium chloride flush (NS) 0.9 % injection 3 mL  3 mL Intravenous Q12H Scoggins, Amber, NP        Allergies:   Patient has no known allergies.    Social History:   reports that he quit smoking about 26 years ago. His smoking use included cigarettes. He has never used smokeless tobacco. He reports current alcohol use of about 12.0 standard drinks of alcohol per week. He reports that he does not use drugs.   Family History:  family history includes Hypertension in his mother.    ROS:     Review of Systems  Constitutional: Negative.   HENT: Negative.    Eyes: Negative.   Respiratory: Negative.    Cardiovascular:  Positive for chest pain.  Gastrointestinal: Negative.   Genitourinary: Negative.   Musculoskeletal: Negative.   Skin: Negative.   Neurological: Negative.   Endo/Heme/Allergies: Negative.   Psychiatric/Behavioral: Negative.    All other systems  reviewed and are negative.     All other systems are reviewed and negative.    PHYSICAL EXAM: VS:  BP (!) 122/58   Pulse 81   Ht 5\' 10"  (1.778 m)   Wt 187 lb (84.8 kg)   SpO2 97%   BMI 26.83 kg/m  , BMI Body mass index is 26.83 kg/m. Last weight:  Wt Readings from Last 3 Encounters:  06/17/22 187 lb (84.8 kg)  06/03/22 190 lb (86.2 kg)  03/19/22 190 lb (86.2 kg)  Physical Exam Vitals reviewed.  Constitutional:      Appearance: Normal appearance. He is normal weight.  HENT:     Head: Normocephalic.     Nose: Nose normal.     Mouth/Throat:     Mouth: Mucous membranes are moist.  Eyes:     Pupils: Pupils are equal, round, and reactive to light.  Cardiovascular:     Rate and Rhythm: Normal rate and regular rhythm.     Pulses: Normal pulses.     Heart sounds: Normal heart sounds.  Pulmonary:     Effort: Pulmonary effort is normal.  Abdominal:     General: Abdomen is flat. Bowel sounds are normal.  Musculoskeletal:        General: Normal range of motion.     Cervical back: Normal range of motion.  Skin:    General: Skin is warm.  Neurological:     General: No focal deficit present.     Mental Status: He is alert.  Psychiatric:        Mood and Affect: Mood normal.       EKG:   Recent Labs: 03/18/2022: BUN 19; Creatinine, Ser 1.00; Hemoglobin 15.0; Platelets 238; Potassium 3.7; Sodium 139    Lipid Panel No results found for: "CHOL", "TRIG", "HDL", "CHOLHDL", "VLDL", "[facility]", "LDLDIRECT"    TESTS                                                                                          [facility] [address] [place], [place] [address] [phone] STUDY:  Gated Stress / Rest Myocardial Perfusion Imaging Tomographic (SPECT) Including attenuation correction Wall Motion, Left Ventricular Ejection Fraction By Gated Technique.Treadmill Stress Test. SEX: Male   WEIGHT: 180 lbs    HEIGHT: 70 in   ARMS UP: YES/NO                                                                         REFERRING PHYSICIAN: Dr.Nasri Boakye Humphrey Rolls  INDICATION FOR STUDY: CP                                                                                                                                                                                                                     TECHNIQUE:  Approximately 20 minutes following the intravenous administration of 10.4 mCi of Tc-35m Sestamibi after stress testing in a reclined supine position with arms above their head if able to do so, gated SPECT imaging of the heart was performed. After about a 2hr break, the patient was injected intravenously with 33.8 mCi of Tc-5m Sestamibi.  Approximately 45 minutes later in the same position as stress imaging SPECT rest imaging of the heart was performed.  STRESS BY:  Neoma Laming, MD PROTOCOL:  Darnell Level                                                                                        MAX PRED HR: 144                     85%: 122               75%: 108                                                                                                                   RESTING BP: 134/82  RESTING HR: 90 PEAK BP: 152/88   PEAK HR: 114 (79%)  EXERCISE DURATION:   3:19                                           METS:  4.6    REASON FOR TEST TERMINATION: Fatigue. Leg pain.                                                                                                                                  SYMPTOMS: Fatigue. Leg pain.  [facility] TREADMILL SCORE:  3.25                                      RISK: Moderate                                                                                                                                                                                                            EKG RESULTS:  NSR. 72/min. RBBB. No significant ST changes at peak exercise.                                                             IMAGE QUALITY: Good  PERFUSION/WALL MOTION FINDINGS: EF = 92%. Small mild fixed partially reversible basal mid and apical inferior wall defects, normal wall motion.                                                                           IMPRESSION:  Equivocal stress test with normal LVEF, consider CCTA.                                                                                                                                                                                                                                                                                       Neoma Laming, MD Stress Interpreting Physician / Nuclear Interpreting Physician                         Neoma Laming MD  Electronically signed by: Neoma Laming     Date: 02/14/2022 11:38 Other studies Reviewed: Additional studies/ records that were reviewed today include:  Review of the above records demonstrates:       No data to display            ASSESSMENT AND PLAN:    ICD-10-CM   1. Coronary artery disease involving native coronary artery of native heart without angina pectoris  0000000 Basic metabolic panel    CT CORONARY MORPH W/CTA COR W/SCORE W/CA W/CM &/OR WO/CM    2. Bilateral carotid artery stenosis  [id number] Basic metabolic panel    CT CORONARY MORPH W/CTA COR W/SCORE W/CA W/CM &/OR WO/CM    3. Essential hypertension  [id number] Basic metabolic panel     CT CORONARY MORPH W/CTA COR W/SCORE W/CA W/CM &/OR WO/CM    4. Gastroesophageal reflux disease with esophagitis without hemorrhage  [id number] Basic metabolic panel    CT CORONARY MORPH W/CTA COR W/SCORE W/CA W/CM &/OR WO/CM    5. Other chest pain  [id number] Basic metabolic panel    CT CORONARY MORPH W/CTA COR W/SCORE W/CA W/CM &/OR WO/CM   had recurrent chest pain, and had equivacal stress test 11/23, thus will do ccta, and refil isosrbide.    6. Precordial pain  R07.2 CT CORONARY MORPH W/CTA COR W/SCORE W/CA W/CM &/OR WO/CM       Problem List Items Addressed This Visit       Cardiovascular and Mediastinum   CAD (coronary artery disease) - Primary   Relevant Medications   ranolazine (RANEXA) 500 MG 12 hr tablet   isosorbide mononitrate (IMDUR) 60 MG 24 hr tablet   Other Relevant Orders   Basic metabolic panel   CT CORONARY MORPH W/CTA COR W/SCORE W/CA W/CM &/OR WO/CM   Essential hypertension   Relevant Medications   ranolazine (RANEXA) 500 MG 12 hr tablet   isosorbide mononitrate (IMDUR) 60 MG 24 hr tablet   Other Relevant Orders   Basic metabolic panel   CT CORONARY MORPH W/CTA COR W/SCORE W/CA W/CM &/OR WO/CM   Carotid stenosis   Relevant Medications   ranolazine (RANEXA) 500 MG 12 hr tablet   isosorbide mononitrate (IMDUR) 60 MG 24 hr tablet   Other Relevant Orders   Basic metabolic panel   CT CORONARY MORPH W/CTA COR W/SCORE W/CA W/CM &/OR WO/CM     Digestive   GERD (gastroesophageal reflux disease)   Relevant Orders   Basic metabolic panel   CT CORONARY MORPH W/CTA COR W/SCORE W/CA W/CM &/OR WO/CM   Other Visit Diagnoses     Other chest pain       had recurrent chest pain, and had equivacal stress test 11/23, thus will do ccta, and refil isosrbide.   Relevant Orders   Basic metabolic panel   CT CORONARY MORPH W/CTA COR W/SCORE W/CA W/CM &/OR WO/CM   Precordial pain       Relevant Orders   CT CORONARY MORPH W/CTA COR W/SCORE W/CA W/CM &/OR WO/CM           Disposition:   No follow-ups on file.    Total time spent: 30 minutes  Signed,  Neoma Laming, MD  06/17/2022 9:34 AM    [facility]

## 2022-06-18 LAB — BASIC METABOLIC PANEL
BUN/Creatinine Ratio: 13 (ref 10–24)
BUN: 14 mg/dL (ref 8–27)
CO2: 24 mmol/L (ref 20–29)
Calcium: 9.1 mg/dL (ref 8.6–10.2)
Chloride: 102 mmol/L (ref 96–106)
Creatinine, Ser: 1.07 mg/dL (ref 0.76–1.27)
Glucose: 116 mg/dL — ABNORMAL HIGH (ref 70–99)
Potassium: 4.1 mmol/L (ref 3.5–5.2)
Sodium: 141 mmol/L (ref 134–144)
eGFR: 71 mL/min/{1.73_m2} (ref >59)

## 2022-06-20 ENCOUNTER — Other Ambulatory Visit: Payer: Medicare PPO

## 2022-06-24 ENCOUNTER — Ambulatory Visit: Payer: Medicare PPO | Admitting: Cardiovascular Disease

## 2022-07-16 ENCOUNTER — Ambulatory Visit (INDEPENDENT_AMBULATORY_CARE_PROVIDER_SITE_OTHER): Payer: Medicare PPO

## 2022-07-16 DIAGNOSIS — K2100 Gastro-esophageal reflux disease with esophagitis, without bleeding: Secondary | ICD-10-CM

## 2022-07-16 DIAGNOSIS — R0789 Other chest pain: Secondary | ICD-10-CM

## 2022-07-16 DIAGNOSIS — I10 Essential (primary) hypertension: Secondary | ICD-10-CM

## 2022-07-16 DIAGNOSIS — I2510 Atherosclerotic heart disease of native coronary artery without angina pectoris: Secondary | ICD-10-CM

## 2022-07-16 DIAGNOSIS — R072 Precordial pain: Secondary | ICD-10-CM

## 2022-07-16 DIAGNOSIS — I6523 Occlusion and stenosis of bilateral carotid arteries: Secondary | ICD-10-CM

## 2022-07-16 MED ORDER — IOHEXOL 350 MG/ML SOLN
100.0000 mL | Freq: Once | INTRAVENOUS | Status: AC | PRN
  Administered 2022-07-16: 100 mL via INTRAVENOUS

## 2022-07-19 ENCOUNTER — Ambulatory Visit (INDEPENDENT_AMBULATORY_CARE_PROVIDER_SITE_OTHER): Payer: Medicare PPO | Admitting: Cardiovascular Disease

## 2022-07-19 ENCOUNTER — Encounter: Payer: Self-pay | Admitting: Cardiovascular Disease

## 2022-07-19 ENCOUNTER — Telehealth: Payer: Self-pay

## 2022-07-19 VITALS — BP 132/80 | HR 73 | Ht 70.0 in | Wt 191.0 lb

## 2022-07-19 DIAGNOSIS — E782 Mixed hyperlipidemia: Secondary | ICD-10-CM

## 2022-07-19 DIAGNOSIS — I6523 Occlusion and stenosis of bilateral carotid arteries: Secondary | ICD-10-CM

## 2022-07-19 DIAGNOSIS — R0602 Shortness of breath: Secondary | ICD-10-CM | POA: Diagnosis not present

## 2022-07-19 DIAGNOSIS — I10 Essential (primary) hypertension: Secondary | ICD-10-CM | POA: Diagnosis not present

## 2022-07-19 DIAGNOSIS — I25118 Atherosclerotic heart disease of native coronary artery with other forms of angina pectoris: Secondary | ICD-10-CM

## 2022-07-19 DIAGNOSIS — I2089 Other forms of angina pectoris: Secondary | ICD-10-CM

## 2022-07-19 MED ORDER — ISOSORBIDE MONONITRATE ER 60 MG PO TB24: 60.0000 mg | ORAL_TABLET | Freq: Every evening | ORAL | 3 refills | Status: DC

## 2022-07-19 NOTE — Progress Notes (Addendum)
Cardiology Office Note   Date:  07/19/2022   ID:  Scott Parker, DOB 11/26/1945, MRN [id number]  PCP:  System, Provider Not In  Cardiologist:  Adrian Blackwater, MD      History of Present Illness: Scott Parker is a 77 y.o. male who presents for  Chief Complaint  Patient presents with   Follow-up    CCTA Results    HPI    Past Medical History:  Diagnosis Date   Arthritis    spinal column   COPD (chronic obstructive pulmonary disease)    Coronary artery disease    Dyspnea    with exertion   Dysrhythmia    GERD (gastroesophageal reflux disease)    Gout    right foot   HOH (hard of hearing)    Hypercholesterolemia    Hyperlipemia    Hypertension    MRSA (methicillin resistant Staphylococcus aureus) 2016   HX of right lower leg   Wears dentures    full upper, partial lower     Past Surgical History:  Procedure Laterality Date   CARDIAC CATHETERIZATION  12/2019   CAROTID PTA/STENT INTERVENTION Left 06/28/2020   Procedure: CAROTID PTA/STENT INTERVENTION;  Surgeon: Renford Dills, MD;  Location: [facility];  Service: Cardiovascular;  Laterality: Left;   CATARACT EXTRACTION W/PHACO Left 02/22/2020   Procedure: CATARACT EXTRACTION PHACO AND INTRAOCULAR LENS PLACEMENT (IOC) LEFT 4.45 00:41.2;  Surgeon: Galen Manila, MD;  Location: [facility];  Service: Ophthalmology;  Laterality: Left;   CATARACT EXTRACTION W/PHACO Right 03/14/2020   Procedure: CATARACT EXTRACTION PHACO AND INTRAOCULAR LENS PLACEMENT (IOC) RIGHT 6.06 00:50.4;  Surgeon: Galen Manila, MD;  Location: [facility];  Service: Ophthalmology;  Laterality: Right;   COLONOSCOPY     pt states he has had x 2   CORONARY STENT INTERVENTION N/A 11/03/2020   Procedure: CORONARY STENT INTERVENTION;  Surgeon: Iran Ouch, MD;  Location: [facility];  Service: Cardiovascular;  Laterality: N/A;   fusion     cervical 3-7, repair ruptured disc   JOINT REPLACEMENT  Bilateral    bilateral hip replacements   LEFT HEART CATH N/A 01/06/2020   Procedure: Left Heart Cath with possible intervention;  Surgeon: Laurier Nancy, MD;  Location: [facility];  Service: Cardiovascular;  Laterality: N/A;   LEFT HEART CATH AND CORONARY ANGIOGRAPHY Right 11/03/2020   Procedure: LEFT HEART CATH AND CORONARY ANGIOGRAPHY with Possible Interventon;  Surgeon: Laurier Nancy, MD;  Location: [facility];  Service: Cardiovascular;  Laterality: Right;   LEFT HEART CATH AND CORONARY ANGIOGRAPHY N/A 03/19/2022   Procedure: LEFT HEART CATH AND CORONARY ANGIOGRAPHY;  Surgeon: Laurier Nancy, MD;  Location: [facility];  Service: Cardiovascular;  Laterality: N/A;   TOTAL HIP ARTHROPLASTY     bilateral     Current Outpatient Medications  Medication Sig Dispense Refill   acetaminophen (TYLENOL) 325 MG tablet Take 650 mg by mouth 2 (two) times daily.     aspirin EC 81 MG tablet Take 81 mg by mouth daily. Swallow whole.     atorvastatin (LIPITOR) 80 MG tablet Take 1 tablet (80 mg total) by mouth every evening. 90 tablet 0   budesonide-formoterol (SYMBICORT) 160-4.5 MCG/ACT inhaler Inhale 1 puff into the lungs daily.     calcium carbonate (TUMS - DOSED IN MG ELEMENTAL CALCIUM) 500 MG chewable tablet Chew 500 mg by mouth daily as needed for indigestion or heartburn.  Calcium Citrate (CITRACAL PO) Take 1 tablet by mouth daily.     CINNAMON PO Take 1,000 mg by mouth daily.     clopidogrel (PLAVIX) 75 MG tablet Take 1 tablet (75 mg total) by mouth daily. 30 tablet 11   GARLIC PO Take 1 tablet by mouth daily.     hydrochlorothiazide (HYDRODIURIL) 25 MG tablet Take 25 mg by mouth daily.     isosorbide mononitrate (IMDUR) 60 MG 24 hr tablet Take 1 tablet (60 mg total) by mouth every evening. 90 tablet 3   metoprolol tartrate (LOPRESSOR) 50 MG tablet Take 25 mg by mouth 1 day or 1 dose.     Omega-3 Fatty Acids (FISH OIL) 1200 MG CAPS Take 1,200 mg by mouth daily.      pantoprazole (PROTONIX) 40 MG tablet Take 1 tablet (40 mg total) by mouth daily. 30 tablet 11   ranolazine (RANEXA) 500 MG 12 hr tablet Take 1 tablet (500 mg total) by mouth 2 (two) times daily. 60 tablet 2   zinc gluconate 50 MG tablet Take 50 mg by mouth 2 (two) times daily.     albuterol (VENTOLIN HFA) 108 (90 Base) MCG/ACT inhaler Inhale 1-2 puffs into the lungs every 6 (six) hours as needed for wheezing or shortness of breath. (Patient not taking: Reported on 07/19/2022)     No current facility-administered medications for this visit.   Facility-Administered Medications Ordered in Other Visits  Medication Dose Route Frequency Provider Last Rate Last Admin   sodium chloride flush (NS) 0.9 % injection 3 mL  3 mL Intravenous Q12H Adrian Blackwater A, MD       sodium chloride flush (NS) 0.9 % injection 3 mL  3 mL Intravenous Q12H Adrian Blackwater A, MD       sodium chloride flush (NS) 0.9 % injection 3 mL  3 mL Intravenous Q12H Scoggins, Amber, NP        Allergies:   Patient has no known allergies.    Social History:   reports that he quit smoking about 26 years ago. His smoking use included cigarettes. He has never used smokeless tobacco. He reports current alcohol use of about 12.0 standard drinks of alcohol per week. He reports that he does not use drugs.   Family History:  family history includes Hypertension in his mother.    ROS:     Review of Systems  Constitutional: Negative.   HENT: Negative.    Eyes: Negative.   Respiratory: Negative.    Gastrointestinal: Negative.   Genitourinary: Negative.   Musculoskeletal: Negative.   Skin: Negative.   Neurological: Negative.   Endo/Heme/Allergies: Negative.   Psychiatric/Behavioral: Negative.    All other systems reviewed and are negative.     All other systems are reviewed and negative.    PHYSICAL EXAM: VS:  BP 132/80   Pulse 73   Ht  (1.778 m)   Wt 191 lb (86.6 kg)   SpO2 98%   BMI 27.41 kg/m  , BMI Body mass index  is 27.41 kg/m. Last weight:  Wt Readings from Last 3 Encounters:  07/19/22 191 lb (86.6 kg)  06/17/22 187 lb (84.8 kg)  06/03/22 190 lb (86.2 kg)     Physical Exam Vitals reviewed.  Constitutional:      Appearance: Normal appearance. He is normal weight.  HENT:     Head: Normocephalic.     Nose: Nose normal.     Mouth/Throat:     Mouth: Mucous membranes are moist.  Eyes:     Pupils: Pupils are equal, round, and reactive to light.  Cardiovascular:     Rate and Rhythm: Normal rate and regular rhythm.     Pulses: Normal pulses.     Heart sounds: Normal heart sounds.  Pulmonary:     Effort: Pulmonary effort is normal.  Abdominal:     General: Abdomen is flat. Bowel sounds are normal.  Musculoskeletal:        General: Normal range of motion.     Cervical back: Normal range of motion.  Skin:    General: Skin is warm.  Neurological:     General: No focal deficit present.     Mental Status: He is alert.  Psychiatric:        Mood and Affect: Mood normal.       EKG:   Recent Labs: 03/18/2022: Hemoglobin 15.0; Platelets 238 06/17/2022: BUN 14; Creatinine, Ser 1.07; Potassium 4.1; Sodium 141    Lipid Panel No results found for: "CHOL", "TRIG", "HDL", "CHOLHDL", "VLDL", "LDLCALC", "LDLDIRECT"    Other studies Reviewed: Additional studies/ records that were reviewed today include:  Review of the above records demonstrates:       No data to display            ASSESSMENT AND PLAN:    ICD-10-CM   1. SOB (shortness of breath)  R06.02    occasional on exertion    2. Coronary artery disease of native artery of native heart with stable angina pectoris  I25.118     3. 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, bilateral  I65.23     4. Effort angina  I20.89    occasional    5. Essential hypertension  I10     6. Mixed hyperlipidemia  E78.2        Problem List Items Addressed This Visit       Cardiovascular and Mediastinum   Carotid stenosis,  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, bilateral   CAD (coronary artery disease)    CCTA ca score1590, with sever calcification without any obstructive disease       Essential hypertension   Effort angina     Other   Hyperlipidemia   Other Visit Diagnoses     SOB (shortness of breath)    -  Primary   occasional on exertion          Disposition:   Return in about 3 months (around 10/18/2022).    Total time spent: 30 minutes  Signed,  Adrian Blackwater, MD  07/19/2022 11:03 AM    [facility]

## 2022-07-19 NOTE — Telephone Encounter (Signed)
Patient was in office today for his appt, you sent in Isosorbide mono  qd but his last rx in [facility] was Isosorbide Mononitrate 60 mg bid how many times a day should the patient be taking this medicatiion? Patient is out of his meds

## 2022-07-19 NOTE — Addendum Note (Signed)
Addended by: Adrian Blackwater A on: 07/19/2022 11:05 AM   Modules accepted: Orders

## 2022-07-19 NOTE — Assessment & Plan Note (Signed)
CCTA ca score1590, with sever calcification without any obstructive disease

## 2022-07-19 NOTE — Addendum Note (Signed)
Addended by: Adrian Blackwater A on: 07/19/2022 11:03 AM   Modules accepted: Orders

## 2022-07-22 ENCOUNTER — Other Ambulatory Visit: Payer: Self-pay

## 2022-07-22 MED ORDER — ISOSORBIDE MONONITRATE ER 60 MG PO TB24: 60.0000 mg | ORAL_TABLET | Freq: Two times a day (BID) | ORAL | 3 refills | Status: DC

## 2022-07-22 NOTE — Telephone Encounter (Signed)
Sent new rx for patient

## 2022-07-30 ENCOUNTER — Telehealth: Payer: Self-pay

## 2022-07-30 NOTE — Telephone Encounter (Signed)
Pt called and left vm regarding if Dr. Welton Flakes changed dose of rx Metoprolol? Requesting a call back

## 2022-08-01 ENCOUNTER — Other Ambulatory Visit: Payer: Self-pay | Admitting: Cardiovascular Disease

## 2022-08-01 DIAGNOSIS — I10 Essential (primary) hypertension: Secondary | ICD-10-CM

## 2022-08-01 DIAGNOSIS — I25118 Atherosclerotic heart disease of native coronary artery with other forms of angina pectoris: Secondary | ICD-10-CM

## 2022-08-01 MED ORDER — METOPROLOL SUCCINATE ER 50 MG PO TB24: 50.0000 mg | ORAL_TABLET | Freq: Every day | ORAL | 11 refills | Status: DC

## 2022-08-01 NOTE — Progress Notes (Signed)
Metoprolol refill requested by patient. Medication not on med list.

## 2022-09-06 ENCOUNTER — Other Ambulatory Visit: Payer: Self-pay | Admitting: *Deleted

## 2022-09-06 NOTE — Telephone Encounter (Signed)
Patient left VM needing his ranolazine refilled today. Will be out tomorrow. Please advise.

## 2022-09-09 ENCOUNTER — Other Ambulatory Visit: Payer: Self-pay

## 2022-09-09 MED ORDER — RANOLAZINE ER 500 MG PO TB12: 500.0000 mg | ORAL_TABLET | Freq: Two times a day (BID) | ORAL | 2 refills | Status: DC

## 2022-10-18 ENCOUNTER — Encounter: Payer: Self-pay | Admitting: Cardiovascular Disease

## 2022-10-18 ENCOUNTER — Ambulatory Visit (INDEPENDENT_AMBULATORY_CARE_PROVIDER_SITE_OTHER): Payer: Medicare PPO | Admitting: Cardiovascular Disease

## 2022-10-18 VITALS — BP 130/79 | HR 85 | Ht 70.0 in | Wt 187.6 lb

## 2022-10-18 DIAGNOSIS — I6523 Occlusion and stenosis of bilateral carotid arteries: Secondary | ICD-10-CM

## 2022-10-18 DIAGNOSIS — R0789 Other chest pain: Secondary | ICD-10-CM

## 2022-10-18 DIAGNOSIS — I10 Essential (primary) hypertension: Secondary | ICD-10-CM | POA: Diagnosis not present

## 2022-10-18 DIAGNOSIS — I2089 Other forms of angina pectoris: Secondary | ICD-10-CM | POA: Diagnosis not present

## 2022-10-18 DIAGNOSIS — T82855A Stenosis of coronary artery stent, initial encounter: Secondary | ICD-10-CM

## 2022-10-18 DIAGNOSIS — I25118 Atherosclerotic heart disease of native coronary artery with other forms of angina pectoris: Secondary | ICD-10-CM | POA: Diagnosis not present

## 2022-10-18 MED ORDER — NITROGLYCERIN 0.4 MG SL SUBL: 0.4000 mg | SUBLINGUAL_TABLET | SUBLINGUAL | 3 refills | Status: DC | PRN

## 2022-10-18 NOTE — Progress Notes (Signed)
Cardiology Office Note   Date:  10/18/2022   ID:  Scott Parker, DOB 08-08-1945, MRN [id number]  PCP:  System, Provider Not In  Cardiologist:  Adrian Blackwater, MD      History of Present Illness: Scott Parker is a 77 y.o. male who presents for  Chief Complaint  Patient presents with   Follow-up    3 mo F/U    Chest Pain  This is a new problem. The current episode started today. The onset quality is sudden. The problem has been waxing and waning. The pain is present in the substernal region. The pain is at a severity of 3/10. The pain is mild. The quality of the pain is described as heavy.      Past Medical History:  Diagnosis Date   Arthritis    spinal column   COPD (chronic obstructive pulmonary disease) (HCC)    Coronary artery disease    Dyspnea    with exertion   Dysrhythmia    GERD (gastroesophageal reflux disease)    Gout    right foot   HOH (hard of hearing)    Hypercholesterolemia    Hyperlipemia    Hypertension    MRSA (methicillin resistant Staphylococcus aureus) 2016   HX of right lower leg   Wears dentures    full upper, partial lower     Past Surgical History:  Procedure Laterality Date   CARDIAC CATHETERIZATION  12/2019   CAROTID PTA/STENT INTERVENTION Left 06/28/2020   Procedure: CAROTID PTA/STENT INTERVENTION;  Surgeon: Renford Dills, MD;  Location: [facility];  Service: Cardiovascular;  Laterality: Left;   CATARACT EXTRACTION W/PHACO Left 02/22/2020   Procedure: CATARACT EXTRACTION PHACO AND INTRAOCULAR LENS PLACEMENT (IOC) LEFT 4.45 00:41.2;  Surgeon: Galen Manila, MD;  Location: [facility];  Service: Ophthalmology;  Laterality: Left;   CATARACT EXTRACTION W/PHACO Right 03/14/2020   Procedure: CATARACT EXTRACTION PHACO AND INTRAOCULAR LENS PLACEMENT (IOC) RIGHT 6.06 00:50.4;  Surgeon: Galen Manila, MD;  Location: [facility];  Service: Ophthalmology;  Laterality: Right;   COLONOSCOPY     pt  states he has had x 2   CORONARY STENT INTERVENTION N/A 11/03/2020   Procedure: CORONARY STENT INTERVENTION;  Surgeon: Iran Ouch, MD;  Location: [facility];  Service: Cardiovascular;  Laterality: N/A;   fusion     cervical 3-7, repair ruptured disc   JOINT REPLACEMENT Bilateral    bilateral hip replacements   LEFT HEART CATH N/A 01/06/2020   Procedure: Left Heart Cath with possible intervention;  Surgeon: Laurier Nancy, MD;  Location: [facility];  Service: Cardiovascular;  Laterality: N/A;   LEFT HEART CATH AND CORONARY ANGIOGRAPHY Right 11/03/2020   Procedure: LEFT HEART CATH AND CORONARY ANGIOGRAPHY with Possible Interventon;  Surgeon: Laurier Nancy, MD;  Location: [facility];  Service: Cardiovascular;  Laterality: Right;   LEFT HEART CATH AND CORONARY ANGIOGRAPHY N/A 03/19/2022   Procedure: LEFT HEART CATH AND CORONARY ANGIOGRAPHY;  Surgeon: Laurier Nancy, MD;  Location: [facility];  Service: Cardiovascular;  Laterality: N/A;   TOTAL HIP ARTHROPLASTY     bilateral     Current Outpatient Medications  Medication Sig Dispense Refill   ciprofloxacin-dexamethasone (CIPRODEX) OTIC suspension 4 drops 2 (two) times daily.     ezetimibe (ZETIA) 10 MG tablet Take 10 mg by mouth daily.     nitroGLYCERIN (NITROSTAT) 0.4 MG SL tablet Place 1 tablet (0.4 mg total)  under the tongue every 5 (five) minutes as needed for chest pain. 100 tablet 3   acetaminophen (TYLENOL) 325 MG tablet Take 650 mg by mouth 2 (two) times daily.     aspirin EC 81 MG tablet Take 81 mg by mouth daily. Swallow whole.     atorvastatin (LIPITOR) 80 MG tablet Take 1 tablet (80 mg total) by mouth every evening. 90 tablet 0   budesonide-formoterol (SYMBICORT) 160-4.5 MCG/ACT inhaler Inhale 1 puff into the lungs daily.     calcium carbonate (TUMS - DOSED IN MG ELEMENTAL CALCIUM) 500 MG chewable tablet Chew 500 mg by mouth daily as needed for indigestion or heartburn.     Calcium  Citrate (CITRACAL PO) Take 1 tablet by mouth daily.     CINNAMON PO Take 1,000 mg by mouth daily.     clopidogrel (PLAVIX) 75 MG tablet Take 1 tablet (75 mg total) by mouth daily. 30 tablet 11   GARLIC PO Take 1 tablet by mouth daily.     hydrochlorothiazide (HYDRODIURIL) 25 MG tablet Take 25 mg by mouth daily.     isosorbide mononitrate (IMDUR) 60 MG 24 hr tablet Take 1 tablet (60 mg total) by mouth 2 (two) times daily. 180 tablet 3   metoprolol succinate (TOPROL XL) 50 MG 24 hr tablet Take 1 tablet (50 mg total) by mouth daily. Take with or immediately following a meal. 30 tablet 11   Omega-3 Fatty Acids (FISH OIL) 1200 MG CAPS Take 1,200 mg by mouth daily.     pantoprazole (PROTONIX) 40 MG tablet Take 1 tablet (40 mg total) by mouth daily. 30 tablet 11   ranolazine (RANEXA) 500 MG 12 hr tablet Take 1 tablet (500 mg total) by mouth 2 (two) times daily. 60 tablet 2   zinc gluconate 50 MG tablet Take 50 mg by mouth 2 (two) times daily.     No current facility-administered medications for this visit.    Allergies:   Patient has no known allergies.    Social History:   reports that he quit smoking about 26 years ago. His smoking use included cigarettes. He has never used smokeless tobacco. He reports current alcohol use of about 12.0 standard drinks of alcohol per week. He reports that he does not use drugs.   Family History:  family history includes Hypertension in his mother.    ROS:     Review of Systems  Constitutional: Negative.   HENT: Negative.    Eyes: Negative.   Respiratory: Negative.    Cardiovascular:  Positive for chest pain.  Gastrointestinal: Negative.   Genitourinary: Negative.   Musculoskeletal: Negative.   Skin: Negative.   Neurological: Negative.   Endo/Heme/Allergies: Negative.   Psychiatric/Behavioral: Negative.    All other systems reviewed and are negative.     All other systems are reviewed and negative.    PHYSICAL EXAM: VS:  BP 130/79   Pulse 85    Ht 5\' 10"  (1.778 m)   Wt 187 lb 9.6 oz (85.1 kg)   SpO2 98%   BMI 26.92 kg/m  , BMI Body mass index is 26.92 kg/m. Last weight:  Wt Readings from Last 3 Encounters:  10/18/22 187 lb 9.6 oz (85.1 kg)  07/19/22 191 lb (86.6 kg)  06/17/22 187 lb (84.8 kg)     Physical Exam Vitals reviewed.  Constitutional:      Appearance: Normal appearance. He is normal weight.  HENT:     Head: Normocephalic.     Nose: Nose  normal.     Mouth/Throat:     Mouth: Mucous membranes are moist.  Eyes:     Pupils: Pupils are equal, round, and reactive to light.  Cardiovascular:     Rate and Rhythm: Normal rate and regular rhythm.     Pulses: Normal pulses.     Heart sounds: Normal heart sounds.  Pulmonary:     Effort: Pulmonary effort is normal.  Abdominal:     General: Abdomen is flat. Bowel sounds are normal.  Musculoskeletal:        General: Normal range of motion.     Cervical back: Normal range of motion.  Skin:    General: Skin is warm.  Neurological:     General: No focal deficit present.     Mental Status: He is alert.  Psychiatric:        Mood and Affect: Mood normal.       EKG:   Recent Labs: 03/18/2022: Hemoglobin 15.0; Platelets 238 06/17/2022: BUN 14; Creatinine, Ser 1.07; Potassium 4.1; Sodium 141    Lipid Panel No results found for: "CHOL", "TRIG", "HDL", "CHOLHDL", "VLDL", "LDLCALC", "LDLDIRECT"    Other studies Reviewed: Additional studies/ records that were reviewed today include:  Review of the above records demonstrates:       No data to display            ASSESSMENT AND PLAN:    ICD-10-CM   1. Coronary artery disease of native artery of native heart with stable angina pectoris (HCC)  I25.118 MYOCARDIAL PERFUSION IMAGING    PCV ECHOCARDIOGRAM COMPLETE    nitroGLYCERIN (NITROSTAT) 0.4 MG SL tablet    2. Bilateral carotid artery stenosis  I65.23 MYOCARDIAL PERFUSION IMAGING    PCV ECHOCARDIOGRAM COMPLETE    nitroGLYCERIN (NITROSTAT) 0.4 MG SL  tablet    3. Effort angina  I20.89 MYOCARDIAL PERFUSION IMAGING    PCV ECHOCARDIOGRAM COMPLETE    nitroGLYCERIN (NITROSTAT) 0.4 MG SL tablet    4. Essential hypertension  I10 MYOCARDIAL PERFUSION IMAGING    PCV ECHOCARDIOGRAM COMPLETE    nitroGLYCERIN (NITROSTAT) 0.4 MG SL tablet    5. Other chest pain  R07.89 MYOCARDIAL PERFUSION IMAGING    PCV ECHOCARDIOGRAM COMPLETE    nitroGLYCERIN (NITROSTAT) 0.4 MG SL tablet   Ranexa, isosrbide not helping, will do echo, stress test    6. Coronary stent restenosis, initial encounter  T82.855A MYOCARDIAL PERFUSION IMAGING    PCV ECHOCARDIOGRAM COMPLETE    nitroGLYCERIN (NITROSTAT) 0.4 MG SL tablet   will do stress test       Problem List Items Addressed This Visit       Cardiovascular and Mediastinum   CAD (coronary artery disease) - Primary   Relevant Medications   ezetimibe (ZETIA) 10 MG tablet   nitroGLYCERIN (NITROSTAT) 0.4 MG SL tablet   Other Relevant Orders   MYOCARDIAL PERFUSION IMAGING   PCV ECHOCARDIOGRAM COMPLETE   Essential hypertension   Relevant Medications   ezetimibe (ZETIA) 10 MG tablet   nitroGLYCERIN (NITROSTAT) 0.4 MG SL tablet   Other Relevant Orders   MYOCARDIAL PERFUSION IMAGING   PCV ECHOCARDIOGRAM COMPLETE   Carotid stenosis   Relevant Medications   ezetimibe (ZETIA) 10 MG tablet   nitroGLYCERIN (NITROSTAT) 0.4 MG SL tablet   Other Relevant Orders   MYOCARDIAL PERFUSION IMAGING   PCV ECHOCARDIOGRAM COMPLETE   Effort angina   Relevant Medications   ezetimibe (ZETIA) 10 MG tablet   nitroGLYCERIN (NITROSTAT) 0.4 MG SL tablet   Other Relevant  Orders   MYOCARDIAL PERFUSION IMAGING   PCV ECHOCARDIOGRAM COMPLETE   Other Visit Diagnoses     Other chest pain       Ranexa, isosrbide not helping, will do echo, stress test   Relevant Medications   nitroGLYCERIN (NITROSTAT) 0.4 MG SL tablet   Other Relevant Orders   MYOCARDIAL PERFUSION IMAGING   PCV ECHOCARDIOGRAM COMPLETE   Coronary stent restenosis,  initial encounter       will do stress test   Relevant Medications   nitroGLYCERIN (NITROSTAT) 0.4 MG SL tablet   Other Relevant Orders   MYOCARDIAL PERFUSION IMAGING   PCV ECHOCARDIOGRAM COMPLETE          Disposition:   Return in about 3 weeks (around 11/08/2022) for echo, stress test.    Total time spent: 30 minutes  Signed,  Adrian Blackwater, MD  10/18/2022 10:45 AM    [facility]

## 2022-10-23 ENCOUNTER — Ambulatory Visit (INDEPENDENT_AMBULATORY_CARE_PROVIDER_SITE_OTHER): Payer: Medicare PPO

## 2022-10-23 DIAGNOSIS — I25118 Atherosclerotic heart disease of native coronary artery with other forms of angina pectoris: Secondary | ICD-10-CM | POA: Diagnosis not present

## 2022-10-23 DIAGNOSIS — R0789 Other chest pain: Secondary | ICD-10-CM | POA: Diagnosis not present

## 2022-10-23 DIAGNOSIS — I2089 Other forms of angina pectoris: Secondary | ICD-10-CM | POA: Diagnosis not present

## 2022-10-23 DIAGNOSIS — I6523 Occlusion and stenosis of bilateral carotid arteries: Secondary | ICD-10-CM | POA: Diagnosis not present

## 2022-10-23 DIAGNOSIS — T82855A Stenosis of coronary artery stent, initial encounter: Secondary | ICD-10-CM

## 2022-10-23 DIAGNOSIS — I10 Essential (primary) hypertension: Secondary | ICD-10-CM

## 2022-10-23 MED ORDER — TECHNETIUM TC 99M SESTAMIBI GENERIC - CARDIOLITE
31.0000 | Freq: Once | INTRAVENOUS | Status: AC | PRN
  Administered 2022-10-23: 31 via INTRAVENOUS

## 2022-10-23 MED ORDER — TECHNETIUM TC 99M SESTAMIBI GENERIC - CARDIOLITE
10.9000 | Freq: Once | INTRAVENOUS | Status: AC | PRN
  Administered 2022-10-23: 10.9 via INTRAVENOUS

## 2022-11-04 ENCOUNTER — Ambulatory Visit: Payer: Medicare PPO

## 2022-11-04 DIAGNOSIS — I6523 Occlusion and stenosis of bilateral carotid arteries: Secondary | ICD-10-CM

## 2022-11-04 DIAGNOSIS — I340 Nonrheumatic mitral (valve) insufficiency: Secondary | ICD-10-CM

## 2022-11-04 DIAGNOSIS — I2089 Other forms of angina pectoris: Secondary | ICD-10-CM

## 2022-11-04 DIAGNOSIS — I25118 Atherosclerotic heart disease of native coronary artery with other forms of angina pectoris: Secondary | ICD-10-CM

## 2022-11-04 DIAGNOSIS — I10 Essential (primary) hypertension: Secondary | ICD-10-CM

## 2022-11-04 DIAGNOSIS — R0789 Other chest pain: Secondary | ICD-10-CM

## 2022-11-04 DIAGNOSIS — T82855A Stenosis of coronary artery stent, initial encounter: Secondary | ICD-10-CM

## 2022-11-08 ENCOUNTER — Encounter: Payer: Self-pay | Admitting: Cardiovascular Disease

## 2022-11-08 ENCOUNTER — Ambulatory Visit (INDEPENDENT_AMBULATORY_CARE_PROVIDER_SITE_OTHER): Payer: Medicare PPO | Admitting: Cardiovascular Disease

## 2022-11-08 VITALS — BP 130/70 | HR 89 | Ht 70.0 in | Wt 186.2 lb

## 2022-11-08 DIAGNOSIS — I2089 Other forms of angina pectoris: Secondary | ICD-10-CM | POA: Diagnosis not present

## 2022-11-08 DIAGNOSIS — E782 Mixed hyperlipidemia: Secondary | ICD-10-CM | POA: Diagnosis not present

## 2022-11-08 DIAGNOSIS — I10 Essential (primary) hypertension: Secondary | ICD-10-CM

## 2022-11-08 DIAGNOSIS — R0789 Other chest pain: Secondary | ICD-10-CM | POA: Diagnosis not present

## 2022-11-08 DIAGNOSIS — I25118 Atherosclerotic heart disease of native coronary artery with other forms of angina pectoris: Secondary | ICD-10-CM

## 2022-11-08 NOTE — Progress Notes (Signed)
Cardiology Office Note   Date:  11/08/2022   ID:  Scott Parker, DOB 1945/08/10, MRN [id number]  PCP:  System, Provider Not In  Cardiologist:  Adrian Blackwater, MD      History of Present Illness: Scott Parker is a 77 y.o. male who presents for  Chief Complaint  Patient presents with   Follow-up    ECHO, NST results    Doing well      Past Medical History:  Diagnosis Date   Arthritis    spinal column   COPD (chronic obstructive pulmonary disease) (HCC)    Coronary artery disease    Dyspnea    with exertion   Dysrhythmia    GERD (gastroesophageal reflux disease)    Gout    right foot   HOH (hard of hearing)    Hypercholesterolemia    Hyperlipemia    Hypertension    MRSA (methicillin resistant Staphylococcus aureus) 2016   HX of right lower leg   Wears dentures    full upper, partial lower     Past Surgical History:  Procedure Laterality Date   CARDIAC CATHETERIZATION  12/2019   CAROTID PTA/STENT INTERVENTION Left 06/28/2020   Procedure: CAROTID PTA/STENT INTERVENTION;  Surgeon: Renford Dills, MD;  Location: [facility];  Service: Cardiovascular;  Laterality: Left;   CATARACT EXTRACTION W/PHACO Left 02/22/2020   Procedure: CATARACT EXTRACTION PHACO AND INTRAOCULAR LENS PLACEMENT (IOC) LEFT 4.45 00:41.2;  Surgeon: Galen Manila, MD;  Location: [facility];  Service: Ophthalmology;  Laterality: Left;   CATARACT EXTRACTION W/PHACO Right 03/14/2020   Procedure: CATARACT EXTRACTION PHACO AND INTRAOCULAR LENS PLACEMENT (IOC) RIGHT 6.06 00:50.4;  Surgeon: Galen Manila, MD;  Location: [facility];  Service: Ophthalmology;  Laterality: Right;   COLONOSCOPY     pt states he has had x 2   CORONARY STENT INTERVENTION N/A 11/03/2020   Procedure: CORONARY STENT INTERVENTION;  Surgeon: Iran Ouch, MD;  Location: [facility];  Service: Cardiovascular;  Laterality: N/A;   fusion     cervical 3-7, repair ruptured disc    JOINT REPLACEMENT Bilateral    bilateral hip replacements   LEFT HEART CATH N/A 01/06/2020   Procedure: Left Heart Cath with possible intervention;  Surgeon: Laurier Nancy, MD;  Location: [facility];  Service: Cardiovascular;  Laterality: N/A;   LEFT HEART CATH AND CORONARY ANGIOGRAPHY Right 11/03/2020   Procedure: LEFT HEART CATH AND CORONARY ANGIOGRAPHY with Possible Interventon;  Surgeon: Laurier Nancy, MD;  Location: [facility];  Service: Cardiovascular;  Laterality: Right;   LEFT HEART CATH AND CORONARY ANGIOGRAPHY N/A 03/19/2022   Procedure: LEFT HEART CATH AND CORONARY ANGIOGRAPHY;  Surgeon: Laurier Nancy, MD;  Location: [facility];  Service: Cardiovascular;  Laterality: N/A;   TOTAL HIP ARTHROPLASTY     bilateral     Current Outpatient Medications  Medication Sig Dispense Refill   acetaminophen (TYLENOL) 325 MG tablet Take 650 mg by mouth 2 (two) times daily.     aspirin EC 81 MG tablet Take 81 mg by mouth daily. Swallow whole.     atorvastatin (LIPITOR) 80 MG tablet Take 1 tablet (80 mg total) by mouth every evening. 90 tablet 0   budesonide-formoterol (SYMBICORT) 160-4.5 MCG/ACT inhaler Inhale 1 puff into the lungs daily.     calcium carbonate (TUMS - DOSED IN MG ELEMENTAL CALCIUM) 500 MG chewable tablet Chew 500 mg by mouth daily as needed for  indigestion or heartburn.     Calcium Citrate (CITRACAL PO) Take 1 tablet by mouth daily.     CINNAMON PO Take 1,000 mg by mouth daily.     ciprofloxacin-dexamethasone (CIPRODEX) OTIC suspension 4 drops 2 (two) times daily.     clopidogrel (PLAVIX) 75 MG tablet Take 1 tablet (75 mg total) by mouth daily. 30 tablet 11   ezetimibe (ZETIA) 10 MG tablet Take 10 mg by mouth daily.     GARLIC PO Take 1 tablet by mouth daily.     hydrochlorothiazide (HYDRODIURIL) 25 MG tablet Take 25 mg by mouth daily.     isosorbide mononitrate (IMDUR) 60 MG 24 hr tablet Take 1 tablet (60 mg total) by mouth 2 (two) times  daily. 180 tablet 3   metoprolol succinate (TOPROL XL) 50 MG 24 hr tablet Take 1 tablet (50 mg total) by mouth daily. Take with or immediately following a meal. 30 tablet 11   nitroGLYCERIN (NITROSTAT) 0.4 MG SL tablet Place 1 tablet (0.4 mg total) under the tongue every 5 (five) minutes as needed for chest pain. 100 tablet 3   Omega-3 Fatty Acids (FISH OIL) 1200 MG CAPS Take 1,200 mg by mouth daily.     pantoprazole (PROTONIX) 40 MG tablet Take 1 tablet (40 mg total) by mouth daily. 30 tablet 11   ranolazine (RANEXA) 500 MG 12 hr tablet Take 1 tablet (500 mg total) by mouth 2 (two) times daily. 60 tablet 2   zinc gluconate 50 MG tablet Take 50 mg by mouth 2 (two) times daily.     No current facility-administered medications for this visit.    Allergies:   Patient has no known allergies.    Social History:   reports that he quit smoking about 27 years ago. His smoking use included cigarettes. He has never used smokeless tobacco. He reports current alcohol use of about 12.0 standard drinks of alcohol per week. He reports that he does not use drugs.   Family History:  family history includes Hypertension in his mother.    ROS:     Review of Systems  Constitutional: Negative.   HENT: Negative.    Eyes: Negative.   Respiratory: Negative.    Gastrointestinal: Negative.   Genitourinary: Negative.   Musculoskeletal: Negative.   Skin: Negative.   Neurological: Negative.   Endo/Heme/Allergies: Negative.   Psychiatric/Behavioral: Negative.    All other systems reviewed and are negative.     All other systems are reviewed and negative.    PHYSICAL EXAM: VS:  BP 130/70   Pulse 89   Ht 5\' 10"  (1.778 m)   Wt 186 lb 3.2 oz (84.5 kg)   SpO2 98%   BMI 26.72 kg/m  , BMI Body mass index is 26.72 kg/m. Last weight:  Wt Readings from Last 3 Encounters:  11/08/22 186 lb 3.2 oz (84.5 kg)  10/18/22 187 lb 9.6 oz (85.1 kg)  07/19/22 191 lb (86.6 kg)     Physical Exam Vitals reviewed.   Constitutional:      Appearance: Normal appearance. He is normal weight.  HENT:     Head: Normocephalic.     Nose: Nose normal.     Mouth/Throat:     Mouth: Mucous membranes are moist.  Eyes:     Pupils: Pupils are equal, round, and reactive to light.  Cardiovascular:     Rate and Rhythm: Normal rate and regular rhythm.     Pulses: Normal pulses.     Heart sounds:  Normal heart sounds.  Pulmonary:     Effort: Pulmonary effort is normal.  Abdominal:     General: Abdomen is flat. Bowel sounds are normal.  Musculoskeletal:        General: Normal range of motion.     Cervical back: Normal range of motion.  Skin:    General: Skin is warm.  Neurological:     General: No focal deficit present.     Mental Status: He is alert.  Psychiatric:        Mood and Affect: Mood normal.       EKG:   Recent Labs: 03/18/2022: Hemoglobin 15.0; Platelets 238 06/17/2022: BUN 14; Creatinine, Ser 1.07; Potassium 4.1; Sodium 141    Lipid Panel No results found for: "CHOL", "TRIG", "HDL", "CHOLHDL", "VLDL", "LDLCALC", "LDLDIRECT"    Other studies Reviewed: Additional studies/ records that were reviewed today include:  Review of the above records demonstrates:       No data to display            ASSESSMENT AND PLAN:    ICD-10-CM   1. Coronary artery disease of native artery of native heart with stable angina pectoris (HCC)  I25.118     2. Effort angina  I20.89     3. Essential hypertension  I10     4. Mixed hyperlipidemia  E78.2     5. Other chest pain  R07.89    Stress test fixed defect inferior wall, chest pain better, continue ranexa and isosrbie, EF 57%       Problem List Items Addressed This Visit       Cardiovascular and Mediastinum   CAD (coronary artery disease) - Primary   Essential hypertension   Effort angina     Other   Hyperlipidemia   Other Visit Diagnoses     Other chest pain       Stress test fixed defect inferior wall, chest pain better,  continue ranexa and isosrbie, EF 57%          Disposition:   Return in about 2 months (around 01/08/2023).    Total time spent: 30 minutes  Signed,  Adrian Blackwater, MD  11/08/2022 11:20 AM    [facility]

## 2022-12-11 ENCOUNTER — Other Ambulatory Visit: Payer: Self-pay

## 2022-12-12 MED ORDER — RANOLAZINE ER 500 MG PO TB12: 500.0000 mg | ORAL_TABLET | Freq: Two times a day (BID) | ORAL | 2 refills | Status: DC

## 2022-12-13 IMAGING — CT CT CERVICAL SPINE W/O CM
3 of 4 series · 12 of 34 positions shown, 14 images · non-contrast
Comparison: None.

CLINICAL DATA: Patient had chest pain last night and woke with neck
pain.

EXAM:
CT HEAD WITHOUT CONTRAST
CT CERVICAL SPINE WITHOUT CONTRAST
TECHNIQUE: Multidetector CT imaging of the head and cervical spine was
performed following the standard protocol without intravenous
contrast. Multiplanar CT image reconstructions of the cervical spine
were also generated.

[Series 4: sagittal bone · sagittal · 0.46mm/px · 5 of 119 slices shown, 6 images]
[im 40/119  bone]
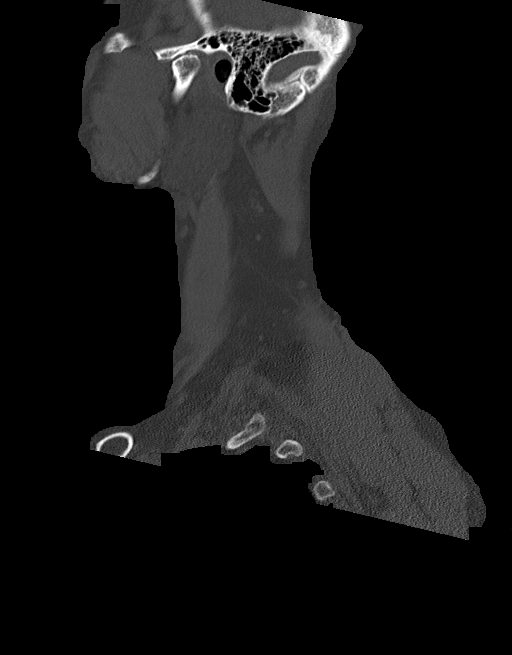
[im 50/119  bone]
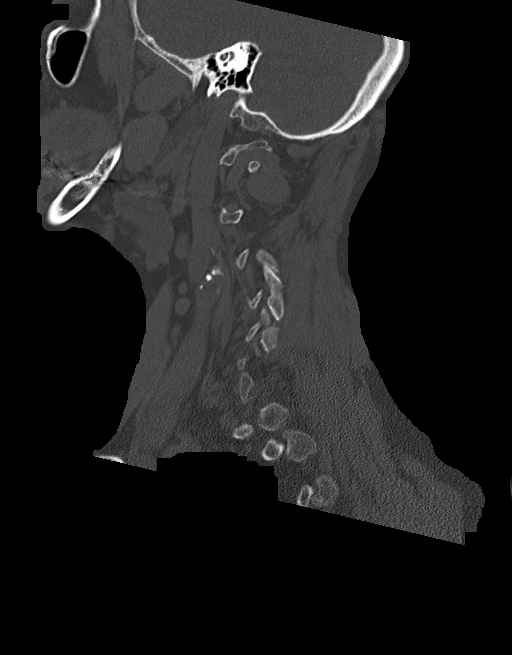
[im 60/119  soft-tissue]
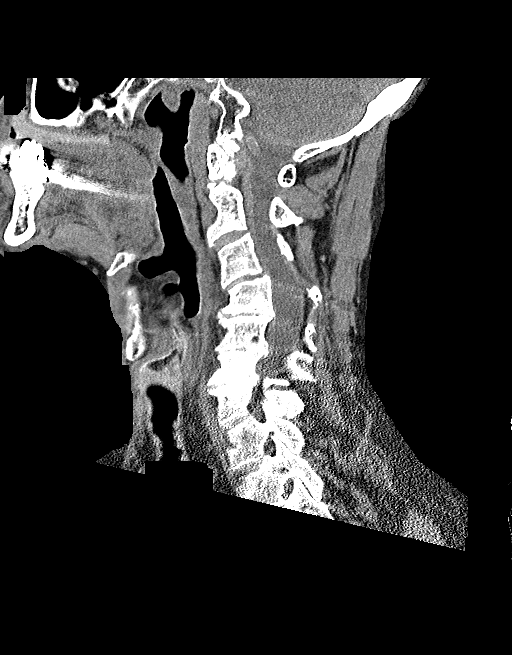
[im 60/119  bone]
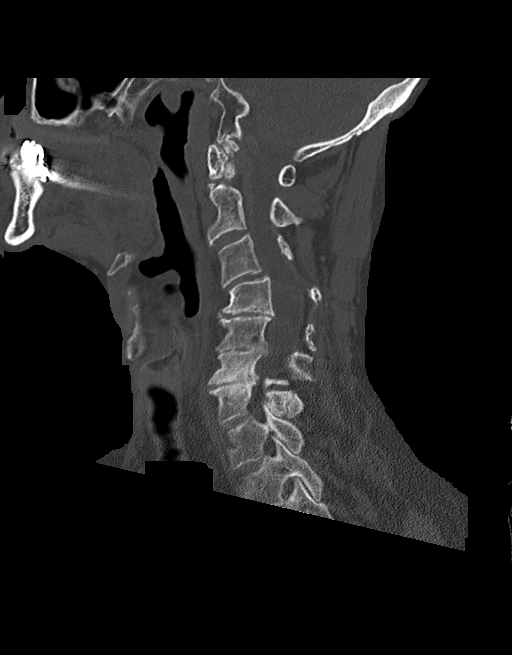
[im 69/119  bone]
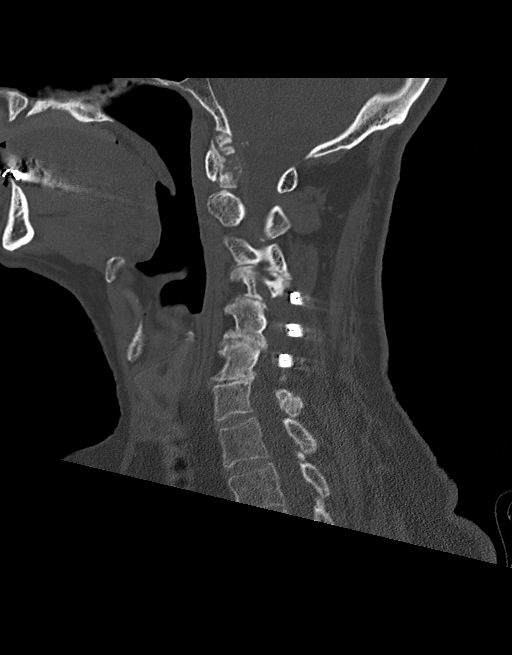
[im 79/119  bone]
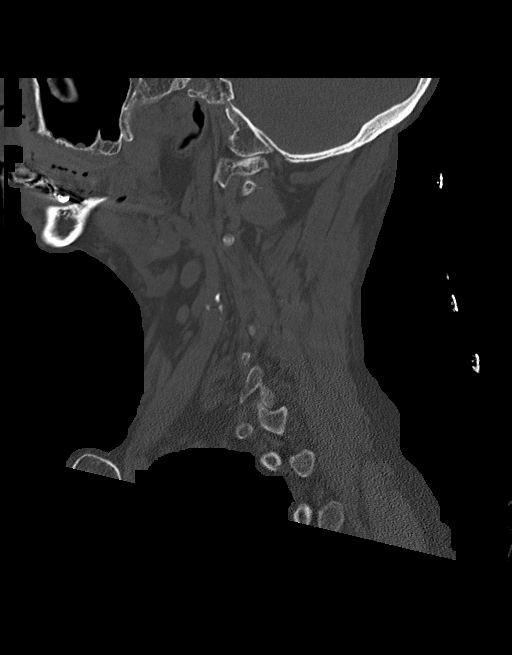

[Series 5: coronal bone · coronal · 0.53mm/px · 3 of 129 slices shown]
[im 26/129  bone]
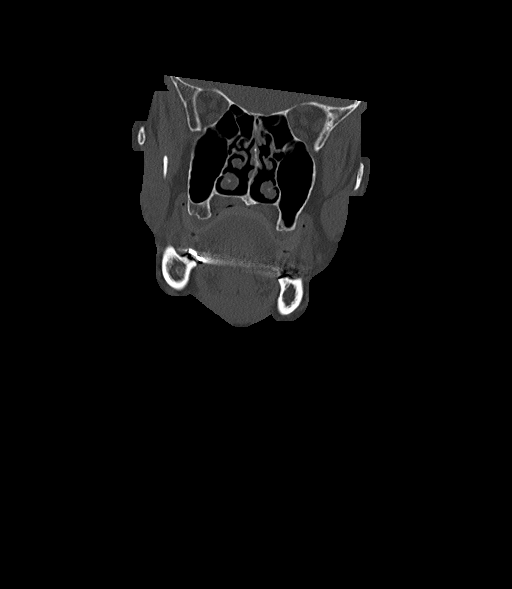
[im 52/129  bone]
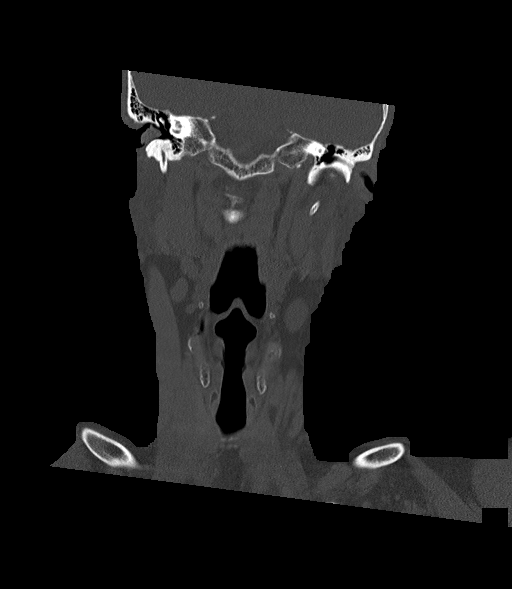
[im 77/129  bone]
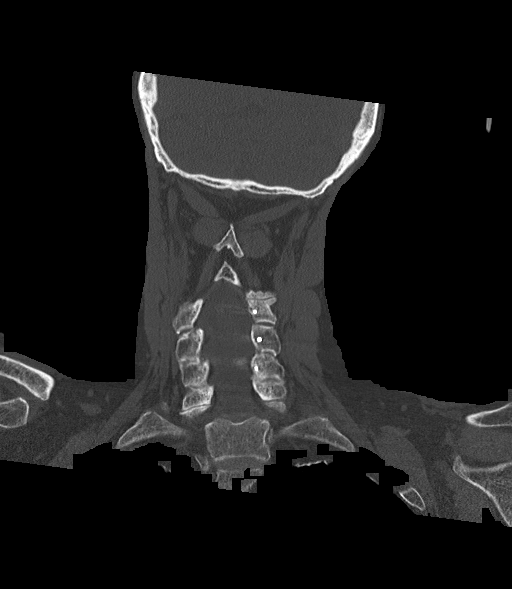

[Series 6: orthogonal bone · axial · 0.54mm/px · z∈[-283,-98]mm · 4 of 131 slices shown, 5 images]
[im 19/131  soft-tissue]
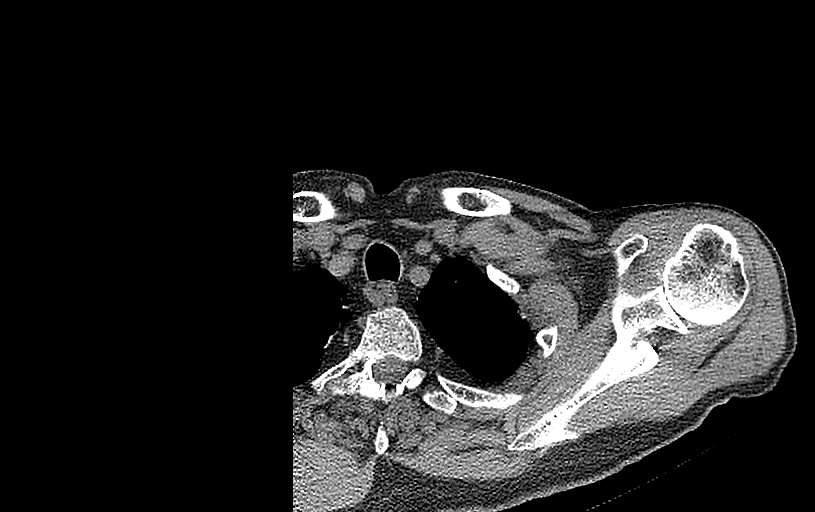
[im 19/131  bone]
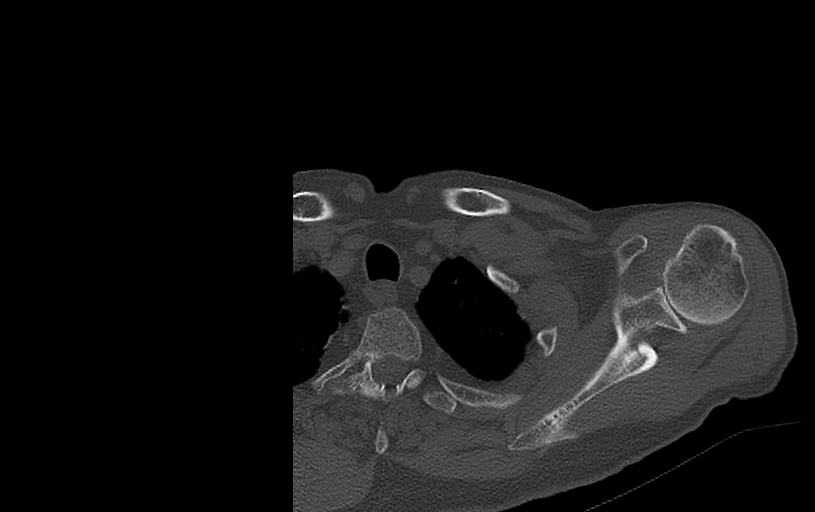
[im 56/131  bone]
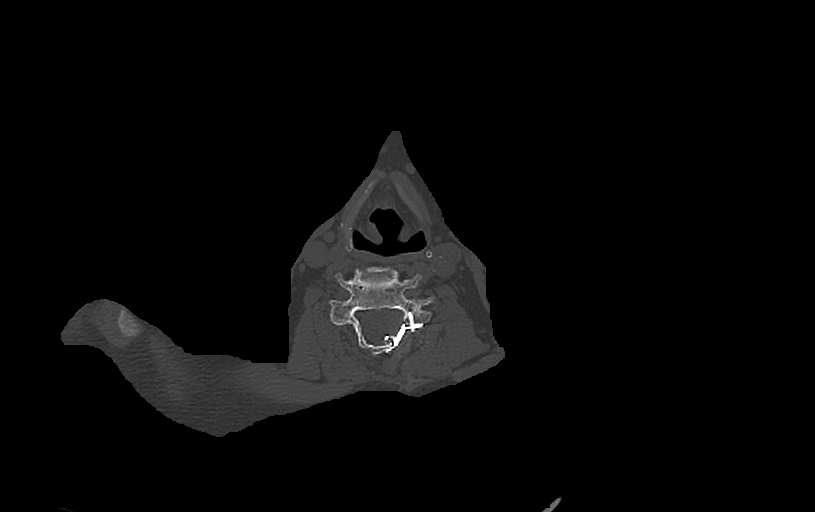
[im 75/131  bone]
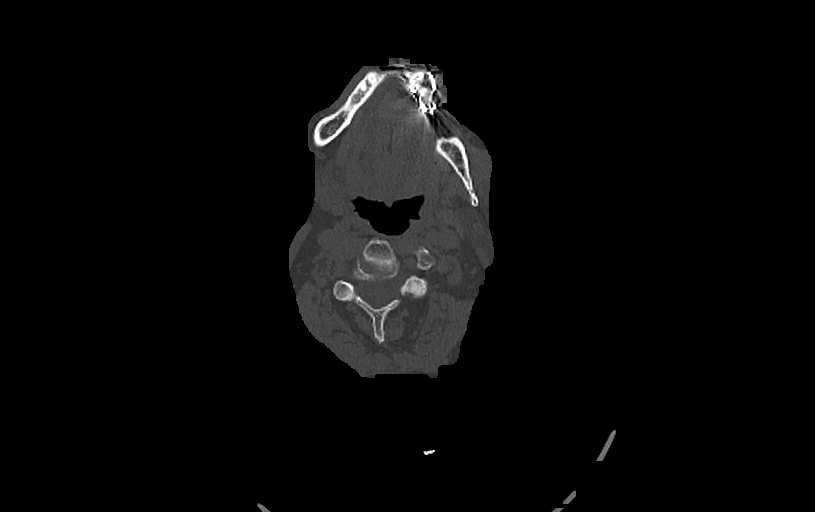
[im 112/131  bone]
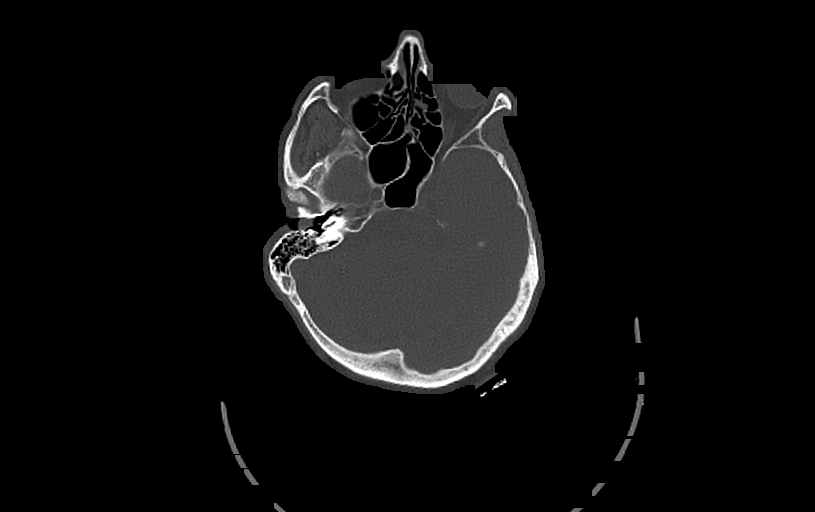

[12 of 34 positions shown; findings below may reference images not displayed]

FINDINGS: CT HEAD FINDINGS

Brain: No evidence of acute infarction, hemorrhage, hydrocephalus,
extra-axial collection or mass lesion/mass effect.

Vascular: No hyperdense vessel or unexpected calcification.

Skull: Normal. Negative for fracture or focal lesion.

Sinuses/Orbits: Globes and orbits are unremarkable. Visualized
sinuses are clear.

Other: None.

CT CERVICAL SPINE FINDINGS

Alignment: Kyphosis, apex at C4.  No spondylolisthesis.

Skull base and vertebrae: No acute fracture. No primary bone lesion
or focal pathologic process.

Soft tissues and spinal canal: No prevertebral fluid or swelling. No
visible canal hematoma.

Disc levels: Previous left laminectomies at C4, C5 and C6 with
fusion plate spanning a laminectomy defects. Mild disc bulging and
endplate spurring noted at these levels. No convincing disc
herniation.

Upper chest: Negative.

Other: None.
IMPRESSION: HEAD CT

1. No acute intracranial abnormalities.

CERVICAL CT

1. No fracture or acute finding.
2. Postsurgical and significant disc degenerative changes.

## 2023-01-06 ENCOUNTER — Other Ambulatory Visit: Payer: Self-pay

## 2023-01-07 MED ORDER — CLOPIDOGREL BISULFATE 75 MG PO TABS: 75.0000 mg | ORAL_TABLET | Freq: Every day | ORAL | 0 refills | Status: DC

## 2023-01-09 ENCOUNTER — Ambulatory Visit (INDEPENDENT_AMBULATORY_CARE_PROVIDER_SITE_OTHER): Payer: Medicare PPO | Admitting: Cardiovascular Disease

## 2023-01-09 ENCOUNTER — Encounter: Payer: Self-pay | Admitting: Cardiovascular Disease

## 2023-01-09 VITALS — BP 130/80 | HR 72 | Ht 70.0 in | Wt 189.4 lb

## 2023-01-09 DIAGNOSIS — I2089 Other forms of angina pectoris: Secondary | ICD-10-CM

## 2023-01-09 DIAGNOSIS — I10 Essential (primary) hypertension: Secondary | ICD-10-CM

## 2023-01-09 DIAGNOSIS — E782 Mixed hyperlipidemia: Secondary | ICD-10-CM

## 2023-01-09 DIAGNOSIS — I25118 Atherosclerotic heart disease of native coronary artery with other forms of angina pectoris: Secondary | ICD-10-CM | POA: Diagnosis not present

## 2023-01-09 DIAGNOSIS — I6523 Occlusion and stenosis of bilateral carotid arteries: Secondary | ICD-10-CM

## 2023-01-09 DIAGNOSIS — T82855A Stenosis of coronary artery stent, initial encounter: Secondary | ICD-10-CM

## 2023-01-09 MED ORDER — CLOPIDOGREL BISULFATE 75 MG PO TABS: 75.0000 mg | ORAL_TABLET | Freq: Every day | ORAL | 0 refills | Status: DC

## 2023-01-09 MED ORDER — RANOLAZINE ER 1000 MG PO TB12: 1000.0000 mg | ORAL_TABLET | Freq: Two times a day (BID) | ORAL | 2 refills | Status: DC

## 2023-01-09 NOTE — Progress Notes (Signed)
Cardiology Office Note   Date:  01/09/2023   ID:  Scott Parker, DOB 30-Jun-1945, MRN [id number]  PCP:  System, Provider Not In  Cardiologist:  Adrian Blackwater, MD      History of Present Illness: Scott Parker is a 77 y.o. male who presents for  Chief Complaint  Patient presents with   Follow-up    2 mo f/u    Doing well      Past Medical History:  Diagnosis Date   Arthritis    spinal column   COPD (chronic obstructive pulmonary disease) (HCC)    Coronary artery disease    Dyspnea    with exertion   Dysrhythmia    GERD (gastroesophageal reflux disease)    Gout    right foot   HOH (hard of hearing)    Hypercholesterolemia    Hyperlipemia    Hypertension    MRSA (methicillin resistant Staphylococcus aureus) 2016   HX of right lower leg   Wears dentures    full upper, partial lower     Past Surgical History:  Procedure Laterality Date   CARDIAC CATHETERIZATION  12/2019   CAROTID PTA/STENT INTERVENTION Left 06/28/2020   Procedure: CAROTID PTA/STENT INTERVENTION;  Surgeon: Renford Dills, MD;  Location: [facility];  Service: Cardiovascular;  Laterality: Left;   CATARACT EXTRACTION W/PHACO Left 02/22/2020   Procedure: CATARACT EXTRACTION PHACO AND INTRAOCULAR LENS PLACEMENT (IOC) LEFT 4.45 00:41.2;  Surgeon: Galen Manila, MD;  Location: [facility];  Service: Ophthalmology;  Laterality: Left;   CATARACT EXTRACTION W/PHACO Right 03/14/2020   Procedure: CATARACT EXTRACTION PHACO AND INTRAOCULAR LENS PLACEMENT (IOC) RIGHT 6.06 00:50.4;  Surgeon: Galen Manila, MD;  Location: [facility];  Service: Ophthalmology;  Laterality: Right;   COLONOSCOPY     pt states he has had x 2   CORONARY STENT INTERVENTION N/A 11/03/2020   Procedure: CORONARY STENT INTERVENTION;  Surgeon: Iran Ouch, MD;  Location: [facility];  Service: Cardiovascular;  Laterality: N/A;   fusion     cervical 3-7, repair ruptured disc   JOINT  REPLACEMENT Bilateral    bilateral hip replacements   LEFT HEART CATH N/A 01/06/2020   Procedure: Left Heart Cath with possible intervention;  Surgeon: Laurier Nancy, MD;  Location: [facility];  Service: Cardiovascular;  Laterality: N/A;   LEFT HEART CATH AND CORONARY ANGIOGRAPHY Right 11/03/2020   Procedure: LEFT HEART CATH AND CORONARY ANGIOGRAPHY with Possible Interventon;  Surgeon: Laurier Nancy, MD;  Location: [facility];  Service: Cardiovascular;  Laterality: Right;   LEFT HEART CATH AND CORONARY ANGIOGRAPHY N/A 03/19/2022   Procedure: LEFT HEART CATH AND CORONARY ANGIOGRAPHY;  Surgeon: Laurier Nancy, MD;  Location: [facility];  Service: Cardiovascular;  Laterality: N/A;   TOTAL HIP ARTHROPLASTY     bilateral     Current Outpatient Medications  Medication Sig Dispense Refill   ranolazine (RANEXA) 1000 MG SR tablet Take 1 tablet (1,000 mg total) by mouth 2 (two) times daily. 60 tablet 2   acetaminophen (TYLENOL) 325 MG tablet Take 650 mg by mouth 2 (two) times daily. (Patient not taking: Reported on 01/09/2023)     aspirin EC 81 MG tablet Take 81 mg by mouth daily. Swallow whole.     atorvastatin (LIPITOR) 80 MG tablet Take 1 tablet (80 mg total) by mouth every evening. 90 tablet 0   budesonide-formoterol (SYMBICORT) 160-4.5 MCG/ACT inhaler Inhale 1 puff into  the lungs daily.     calcium carbonate (TUMS - DOSED IN MG ELEMENTAL CALCIUM) 500 MG chewable tablet Chew 500 mg by mouth daily as needed for indigestion or heartburn.     Calcium Citrate (CITRACAL PO) Take 1 tablet by mouth daily.     CINNAMON PO Take 1,000 mg by mouth daily.     ciprofloxacin-dexamethasone (CIPRODEX) OTIC suspension 4 drops 2 (two) times daily.     clopidogrel (PLAVIX) 75 MG tablet Take 1 tablet (75 mg total) by mouth daily. 90 tablet 0   ezetimibe (ZETIA) 10 MG tablet Take 10 mg by mouth daily.     GARLIC PO Take 1 tablet by mouth daily.     hydrochlorothiazide (HYDRODIURIL) 25  MG tablet Take 25 mg by mouth daily.     isosorbide mononitrate (IMDUR) 60 MG 24 hr tablet Take 1 tablet (60 mg total) by mouth 2 (two) times daily. 180 tablet 3   metoprolol succinate (TOPROL XL) 50 MG 24 hr tablet Take 1 tablet (50 mg total) by mouth daily. Take with or immediately following a meal. 30 tablet 11   nitroGLYCERIN (NITROSTAT) 0.4 MG SL tablet Place 1 tablet (0.4 mg total) under the tongue every 5 (five) minutes as needed for chest pain. 100 tablet 3   Omega-3 Fatty Acids (FISH OIL) 1200 MG CAPS Take 1,200 mg by mouth daily.     pantoprazole (PROTONIX) 40 MG tablet Take 1 tablet (40 mg total) by mouth daily. 30 tablet 11   zinc gluconate 50 MG tablet Take 50 mg by mouth 2 (two) times daily.     No current facility-administered medications for this visit.    Allergies:   Patient has no known allergies.    Social History:   reports that he quit smoking about 27 years ago. His smoking use included cigarettes. He has never used smokeless tobacco. He reports current alcohol use of about 12.0 standard drinks of alcohol per week. He reports that he does not use drugs.   Family History:  family history includes Hypertension in his mother.    ROS:     Review of Systems  Constitutional: Negative.   HENT: Negative.    Eyes: Negative.   Respiratory: Negative.    Gastrointestinal: Negative.   Genitourinary: Negative.   Musculoskeletal: Negative.   Skin: Negative.   Neurological: Negative.   Endo/Heme/Allergies: Negative.   Psychiatric/Behavioral: Negative.    All other systems reviewed and are negative.     All other systems are reviewed and negative.    PHYSICAL EXAM: VS:  BP 130/80   Pulse 72   Ht 5\' 10"  (1.778 m)   Wt 189 lb 6.4 oz (85.9 kg)   SpO2 97%   BMI 27.18 kg/m  , BMI Body mass index is 27.18 kg/m. Last weight:  Wt Readings from Last 3 Encounters:  01/09/23 189 lb 6.4 oz (85.9 kg)  11/08/22 186 lb 3.2 oz (84.5 kg)  10/18/22 187 lb 9.6 oz (85.1 kg)      Physical Exam    EKG:   Recent Labs: 03/18/2022: Hemoglobin 15.0; Platelets 238 06/17/2022: BUN 14; Creatinine, Ser 1.07; Potassium 4.1; Sodium 141    Lipid Panel No results found for: "CHOL", "TRIG", "HDL", "CHOLHDL", "VLDL", "LDLCALC", "LDLDIRECT"    Other studies Reviewed: Additional studies/ records that were reviewed today include:  Review of the above records demonstrates:       No data to display  ASSESSMENT AND PLAN:    ICD-10-CM   1. Coronary artery disease of native artery of native heart with stable angina pectoris (HCC)  I25.118 ranolazine (RANEXA) 1000 MG SR tablet    clopidogrel (PLAVIX) 75 MG tablet   stress test inferior infarction, on cath RCA stent ok, OM had 70%    2. Essential hypertension  I10 ranolazine (RANEXA) 1000 MG SR tablet    clopidogrel (PLAVIX) 75 MG tablet    3. Effort angina (HCC)  I20.89 ranolazine (RANEXA) 1000 MG SR tablet    clopidogrel (PLAVIX) 75 MG tablet   increase ranexa 1000 bid    4. Mixed hyperlipidemia  E78.2 ranolazine (RANEXA) 1000 MG SR tablet    clopidogrel (PLAVIX) 75 MG tablet    5. Coronary stent restenosis, initial encounter  T82.855A ranolazine (RANEXA) 1000 MG SR tablet    clopidogrel (PLAVIX) 75 MG tablet    6. Bilateral carotid artery stenosis  I65.23 ranolazine (RANEXA) 1000 MG SR tablet    clopidogrel (PLAVIX) 75 MG tablet    7. 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, bilateral   Relevant Medications   ranolazine (RANEXA) 1000 MG SR tablet   clopidogrel (PLAVIX) 75 MG tablet   CAD (coronary artery disease) - Primary   Relevant Medications   ranolazine (RANEXA) 1000 MG SR tablet   clopidogrel (PLAVIX) 75 MG tablet   Essential hypertension   Relevant  Medications   ranolazine (RANEXA) 1000 MG SR tablet   clopidogrel (PLAVIX) 75 MG tablet   Carotid stenosis   Relevant Medications   ranolazine (RANEXA) 1000 MG SR tablet   clopidogrel (PLAVIX) 75 MG tablet   Effort angina (HCC)   Relevant Medications   ranolazine (RANEXA) 1000 MG SR tablet   clopidogrel (PLAVIX) 75 MG tablet     Other   Hyperlipidemia   Relevant Medications   ranolazine (RANEXA) 1000 MG SR tablet   clopidogrel (PLAVIX) 75 MG tablet   Other Visit Diagnoses     Coronary stent restenosis, initial encounter       Relevant Medications   ranolazine (RANEXA) 1000 MG SR tablet   clopidogrel (PLAVIX) 75 MG tablet          Disposition:   Return in about 2 months (around 03/11/2023).    Total time spent: 30 minutes  Signed,  Adrian Blackwater, MD  01/09/2023 10:35 AM    [facility]

## 2023-03-11 ENCOUNTER — Ambulatory Visit: Payer: Medicare PPO | Admitting: Cardiovascular Disease

## 2023-03-11 ENCOUNTER — Encounter: Payer: Self-pay | Admitting: Cardiovascular Disease

## 2023-03-11 VITALS — BP 118/68 | HR 76 | Ht 70.0 in | Wt 184.8 lb

## 2023-03-11 DIAGNOSIS — I10 Essential (primary) hypertension: Secondary | ICD-10-CM | POA: Diagnosis not present

## 2023-03-11 DIAGNOSIS — R072 Precordial pain: Secondary | ICD-10-CM

## 2023-03-11 DIAGNOSIS — I6523 Occlusion and stenosis of bilateral carotid arteries: Secondary | ICD-10-CM

## 2023-03-11 DIAGNOSIS — E782 Mixed hyperlipidemia: Secondary | ICD-10-CM

## 2023-03-11 DIAGNOSIS — I259 Chronic ischemic heart disease, unspecified: Secondary | ICD-10-CM

## 2023-03-11 DIAGNOSIS — T82855A Stenosis of coronary artery stent, initial encounter: Secondary | ICD-10-CM

## 2023-03-11 DIAGNOSIS — I25118 Atherosclerotic heart disease of native coronary artery with other forms of angina pectoris: Secondary | ICD-10-CM

## 2023-03-11 MED ORDER — METOPROLOL TARTRATE 25 MG PO TABS: ORAL_TABLET | ORAL | 3 refills | Status: DC

## 2023-03-11 NOTE — Progress Notes (Signed)
Cardiology Office Note   Date:  03/11/2023   ID:  Scott Parker, DOB [birth year], MRN [id number]  PCP:  System, Provider Not In  Cardiologist:  Adrian Blackwater, MD      History of Present Illness: Scott Parker is a 77 y.o. male who presents for  Chief Complaint  Patient presents with   Follow-up    2 month follow up    Doing well      Past Medical History:  Diagnosis Date   Arthritis    spinal column   COPD (chronic obstructive pulmonary disease) (HCC)    Coronary artery disease    Dyspnea    with exertion   Dysrhythmia    GERD (gastroesophageal reflux disease)    Gout    right foot   HOH (hard of hearing)    Hypercholesterolemia    Hyperlipemia    Hypertension    MRSA (methicillin resistant Staphylococcus aureus) 2016   HX of right lower leg   Wears dentures    full upper, partial lower     Past Surgical History:  Procedure Laterality Date   CARDIAC CATHETERIZATION  12/2019   CAROTID PTA/STENT INTERVENTION Left 06/28/2020   Procedure: CAROTID PTA/STENT INTERVENTION;  Surgeon: Renford Dills, MD;  Location: [facility];  Service: Cardiovascular;  Laterality: Left;   CATARACT EXTRACTION W/PHACO Left 02/22/2020   Procedure: CATARACT EXTRACTION PHACO AND INTRAOCULAR LENS PLACEMENT (IOC) LEFT 4.45 00:41.2;  Surgeon: Galen Manila, MD;  Location: [facility];  Service: Ophthalmology;  Laterality: Left;   CATARACT EXTRACTION W/PHACO Right 03/14/2020   Procedure: CATARACT EXTRACTION PHACO AND INTRAOCULAR LENS PLACEMENT (IOC) RIGHT 6.06 00:50.4;  Surgeon: Galen Manila, MD;  Location: [facility];  Service: Ophthalmology;  Laterality: Right;   COLONOSCOPY     pt states he has had x 2   CORONARY STENT INTERVENTION N/A 11/03/2020   Procedure: CORONARY STENT INTERVENTION;  Surgeon: Iran Ouch, MD;  Location: [facility];  Service: Cardiovascular;  Laterality: N/A;   fusion     cervical 3-7, repair ruptured disc    JOINT REPLACEMENT Bilateral    bilateral hip replacements   LEFT HEART CATH N/A 01/06/2020   Procedure: Left Heart Cath with possible intervention;  Surgeon: Laurier Nancy, MD;  Location: [facility];  Service: Cardiovascular;  Laterality: N/A;   LEFT HEART CATH AND CORONARY ANGIOGRAPHY Right 11/03/2020   Procedure: LEFT HEART CATH AND CORONARY ANGIOGRAPHY with Possible Interventon;  Surgeon: Laurier Nancy, MD;  Location: [facility];  Service: Cardiovascular;  Laterality: Right;   LEFT HEART CATH AND CORONARY ANGIOGRAPHY N/A 03/19/2022   Procedure: LEFT HEART CATH AND CORONARY ANGIOGRAPHY;  Surgeon: Laurier Nancy, MD;  Location: [facility];  Service: Cardiovascular;  Laterality: N/A;   TOTAL HIP ARTHROPLASTY     bilateral     Current Outpatient Medications  Medication Sig Dispense Refill   aspirin EC 81 MG tablet Take 81 mg by mouth daily. Swallow whole.     atorvastatin (LIPITOR) 80 MG tablet Take 1 tablet (80 mg total) by mouth every evening. 90 tablet 0   budesonide-formoterol (SYMBICORT) 160-4.5 MCG/ACT inhaler Inhale 1 puff into the lungs daily.     calcium carbonate (TUMS - DOSED IN MG ELEMENTAL CALCIUM) 500 MG chewable tablet Chew 500 mg by mouth daily as needed for indigestion or heartburn.     Calcium Citrate (CITRACAL PO) Take 1 tablet by mouth daily.  CINNAMON PO Take 1,000 mg by mouth daily.     clopidogrel (PLAVIX) 75 MG tablet Take 1 tablet (75 mg total) by mouth daily. 90 tablet 0   ezetimibe (ZETIA) 10 MG tablet Take 10 mg by mouth daily.     GARLIC PO Take 1 tablet by mouth daily.     hydrochlorothiazide (HYDRODIURIL) 25 MG tablet Take 25 mg by mouth daily.     isosorbide mononitrate (IMDUR) 60 MG 24 hr tablet Take 1 tablet (60 mg total) by mouth 2 (two) times daily. 180 tablet 3   metoprolol succinate (TOPROL XL) 50 MG 24 hr tablet Take 1 tablet (50 mg total) by mouth daily. Take with or immediately following a meal. 30 tablet 11    metoprolol tartrate (LOPRESSOR) 25 MG tablet Take 1 tab night before and 1 tab 90 minutres prior to ccta 2 tablet 3   Omega-3 Fatty Acids (FISH OIL) 1200 MG CAPS Take 1,200 mg by mouth daily.     pantoprazole (PROTONIX) 40 MG tablet Take 1 tablet (40 mg total) by mouth daily. 30 tablet 11   ranolazine (RANEXA) 1000 MG SR tablet Take 1 tablet (1,000 mg total) by mouth 2 (two) times daily. 60 tablet 2   zinc gluconate 50 MG tablet Take 50 mg by mouth 2 (two) times daily.     acetaminophen (TYLENOL) 325 MG tablet Take 650 mg by mouth 2 (two) times daily. (Patient not taking: Reported on 01/09/2023)     ciprofloxacin-dexamethasone (CIPRODEX) OTIC suspension 4 drops 2 (two) times daily. (Patient not taking: Reported on 03/11/2023)     nitroGLYCERIN (NITROSTAT) 0.4 MG SL tablet Place 1 tablet (0.4 mg total) under the tongue every 5 (five) minutes as needed for chest pain. (Patient not taking: Reported on 03/11/2023) 100 tablet 3   No current facility-administered medications for this visit.    Allergies:   Benazepril-hydrochlorothiazide    Social History:   reports that he quit smoking about 27 years ago. His smoking use included cigarettes. He has never used smokeless tobacco. He reports current alcohol use of about 12.0 standard drinks of alcohol per week. He reports that he does not use drugs.   Family History:  family history includes Hypertension in his mother.    ROS:     Review of Systems  Constitutional: Negative.   HENT: Negative.    Eyes: Negative.   Respiratory: Negative.    Gastrointestinal: Negative.   Genitourinary: Negative.   Musculoskeletal: Negative.   Skin: Negative.   Neurological: Negative.   Endo/Heme/Allergies: Negative.   Psychiatric/Behavioral: Negative.    All other systems reviewed and are negative.     All other systems are reviewed and negative.    PHYSICAL EXAM: VS:  BP 118/68   Pulse 76   Ht 5\' 10"  (1.778 m)   Wt 184 lb 12.8 oz (83.8 kg)   SpO2  98%   BMI 26.52 kg/m  , BMI Body mass index is 26.52 kg/m. Last weight:  Wt Readings from Last 3 Encounters:  03/11/23 184 lb 12.8 oz (83.8 kg)  01/09/23 189 lb 6.4 oz (85.9 kg)  11/08/22 186 lb 3.2 oz (84.5 kg)     Physical Exam Vitals reviewed.  Constitutional:      Appearance: Normal appearance. He is normal weight.  HENT:     Head: Normocephalic.     Nose: Nose normal.     Mouth/Throat:     Mouth: Mucous membranes are moist.  Eyes:  Pupils: Pupils are equal, round, and reactive to light.  Cardiovascular:     Rate and Rhythm: Normal rate and regular rhythm.     Pulses: Normal pulses.     Heart sounds: Normal heart sounds.  Pulmonary:     Effort: Pulmonary effort is normal.  Abdominal:     General: Abdomen is flat. Bowel sounds are normal.  Musculoskeletal:        General: Normal range of motion.     Cervical back: Normal range of motion.  Skin:    General: Skin is warm.  Neurological:     General: No focal deficit present.     Mental Status: He is alert.  Psychiatric:        Mood and Affect: Mood normal.       EKG:   Recent Labs: 03/18/2022: Hemoglobin 15.0; Platelets 238 06/17/2022: BUN 14; Creatinine, Ser 1.07; Potassium 4.1; Sodium 141    Lipid Panel No results found for: "CHOL", "TRIG", "HDL", "CHOLHDL", "VLDL", "LDLCALC", "LDLDIRECT"    Other studies Reviewed: Additional studies/ records that were reviewed today include:  Review of the above records demonstrates:       No data to display            ASSESSMENT AND PLAN:    ICD-10-CM   1. Coronary artery disease of native artery of native heart with stable angina pectoris (HCC)  I25.118 CT CORONARY MORPH W/CTA COR W/SCORE W/CA W/CM &/OR WO/CM    Basic metabolic panel    metoprolol tartrate (LOPRESSOR) 25 MG tablet    2. Essential hypertension  I10 CT CORONARY MORPH W/CTA COR W/SCORE W/CA W/CM &/OR WO/CM    Basic metabolic panel    metoprolol tartrate (LOPRESSOR) 25 MG tablet     3. Coronary stent restenosis, initial encounter  T82.855A CT CORONARY MORPH W/CTA COR W/SCORE W/CA W/CM &/OR WO/CM    Basic metabolic panel    metoprolol tartrate (LOPRESSOR) 25 MG tablet    4. Mixed hyperlipidemia  E78.2 CT CORONARY MORPH W/CTA COR W/SCORE W/CA W/CM &/OR WO/CM    Basic metabolic panel    metoprolol tartrate (LOPRESSOR) 25 MG tablet    5. Bilateral carotid artery stenosis  I65.23 CT CORONARY MORPH W/CTA COR W/SCORE W/CA W/CM &/OR WO/CM    Basic metabolic panel    metoprolol tartrate (LOPRESSOR) 25 MG tablet    6. Precordial pain  R07.2 CT CORONARY MORPH W/CTA COR W/SCORE W/CA W/CM &/OR WO/CM    Basic metabolic panel    metoprolol tartrate (LOPRESSOR) 25 MG tablet   Stress test abnormal, advise CCTA as has chest tightness    7. Chest pain due to myocardial ischemia, unspecified ischemic chest pain type  I25.9 CT CORONARY MORPH W/CTA COR W/SCORE W/CA W/CM &/OR WO/CM    Basic metabolic panel    metoprolol tartrate (LOPRESSOR) 25 MG tablet       Problem List Items Addressed This Visit       Cardiovascular and Mediastinum   CAD (coronary artery disease) - Primary   Relevant Medications   metoprolol tartrate (LOPRESSOR) 25 MG tablet   Other Relevant Orders   CT CORONARY MORPH W/CTA COR W/SCORE W/CA W/CM &/OR WO/CM   Basic metabolic panel   Essential hypertension   Relevant Medications   metoprolol tartrate (LOPRESSOR) 25 MG tablet   Other Relevant Orders   CT CORONARY MORPH W/CTA COR W/SCORE W/CA W/CM &/OR WO/CM   Basic metabolic panel   Carotid stenosis   Relevant Medications  metoprolol tartrate (LOPRESSOR) 25 MG tablet   Other Relevant Orders   CT CORONARY MORPH W/CTA COR W/SCORE W/CA W/CM &/OR WO/CM   Basic metabolic panel     Other   Hyperlipidemia   Relevant Medications   metoprolol tartrate (LOPRESSOR) 25 MG tablet   Other Relevant Orders   CT CORONARY MORPH W/CTA COR W/SCORE W/CA W/CM &/OR WO/CM   Basic metabolic panel   Other Visit  Diagnoses     Coronary stent restenosis, initial encounter       Relevant Medications   metoprolol tartrate (LOPRESSOR) 25 MG tablet   Other Relevant Orders   CT CORONARY MORPH W/CTA COR W/SCORE W/CA W/CM &/OR WO/CM   Basic metabolic panel   Precordial pain       Stress test abnormal, advise CCTA as has chest tightness   Relevant Medications   metoprolol tartrate (LOPRESSOR) 25 MG tablet   Other Relevant Orders   CT CORONARY MORPH W/CTA COR W/SCORE W/CA W/CM &/OR WO/CM   Basic metabolic panel   Chest pain due to myocardial ischemia, unspecified ischemic chest pain type       Relevant Medications   metoprolol tartrate (LOPRESSOR) 25 MG tablet   Other Relevant Orders   CT CORONARY MORPH W/CTA COR W/SCORE W/CA W/CM &/OR WO/CM   Basic metabolic panel          Disposition:   Return in about 4 weeks (around 04/08/2023) for ccta and f/u.    Total time spent: 30 minutes  Signed,  Adrian Blackwater, MD  03/11/2023 10:26 AM    [facility]

## 2023-03-12 LAB — BASIC METABOLIC PANEL
BUN/Creatinine Ratio: 18 (ref 10–24)
BUN: 18 mg/dL (ref 8–27)
CO2: 25 mmol/L (ref 20–29)
Calcium: 9.1 mg/dL (ref 8.6–10.2)
Chloride: 100 mmol/L (ref 96–106)
Creatinine, Ser: 1.02 mg/dL (ref 0.76–1.27)
Glucose: 148 mg/dL — ABNORMAL HIGH (ref 70–99)
Potassium: 3.6 mmol/L (ref 3.5–5.2)
Sodium: 141 mmol/L (ref 134–144)
eGFR: 76 mL/min/{1.73_m2} (ref >59)

## 2023-03-18 ENCOUNTER — Other Ambulatory Visit: Payer: Self-pay | Admitting: *Deleted

## 2023-03-18 DIAGNOSIS — T82855A Stenosis of coronary artery stent, initial encounter: Secondary | ICD-10-CM

## 2023-03-18 DIAGNOSIS — I6523 Occlusion and stenosis of bilateral carotid arteries: Secondary | ICD-10-CM

## 2023-03-18 DIAGNOSIS — I25118 Atherosclerotic heart disease of native coronary artery with other forms of angina pectoris: Secondary | ICD-10-CM

## 2023-03-18 DIAGNOSIS — I10 Essential (primary) hypertension: Secondary | ICD-10-CM

## 2023-03-18 DIAGNOSIS — E782 Mixed hyperlipidemia: Secondary | ICD-10-CM

## 2023-03-18 DIAGNOSIS — I2089 Other forms of angina pectoris: Secondary | ICD-10-CM

## 2023-03-18 MED ORDER — RANOLAZINE ER 1000 MG PO TB12: 1000.0000 mg | ORAL_TABLET | Freq: Two times a day (BID) | ORAL | 2 refills | Status: DC

## 2023-04-01 ENCOUNTER — Ambulatory Visit: Payer: Medicare PPO

## 2023-04-01 DIAGNOSIS — I10 Essential (primary) hypertension: Secondary | ICD-10-CM

## 2023-04-01 DIAGNOSIS — T82855A Stenosis of coronary artery stent, initial encounter: Secondary | ICD-10-CM

## 2023-04-01 DIAGNOSIS — I259 Chronic ischemic heart disease, unspecified: Secondary | ICD-10-CM | POA: Diagnosis not present

## 2023-04-01 DIAGNOSIS — I6523 Occlusion and stenosis of bilateral carotid arteries: Secondary | ICD-10-CM

## 2023-04-01 DIAGNOSIS — R072 Precordial pain: Secondary | ICD-10-CM | POA: Diagnosis not present

## 2023-04-01 DIAGNOSIS — I25118 Atherosclerotic heart disease of native coronary artery with other forms of angina pectoris: Secondary | ICD-10-CM

## 2023-04-01 DIAGNOSIS — E782 Mixed hyperlipidemia: Secondary | ICD-10-CM

## 2023-04-01 MED ORDER — IOHEXOL 350 MG/ML SOLN
100.0000 mL | Freq: Once | INTRAVENOUS | Status: AC | PRN
  Administered 2023-04-01: 100 mL via INTRAVENOUS

## 2023-04-08 ENCOUNTER — Ambulatory Visit: Payer: Medicare Other | Admitting: Cardiovascular Disease

## 2023-04-08 ENCOUNTER — Encounter: Payer: Self-pay | Admitting: Cardiovascular Disease

## 2023-04-08 VITALS — BP 122/68 | HR 86 | Ht 70.0 in | Wt 182.0 lb

## 2023-04-08 DIAGNOSIS — I6523 Occlusion and stenosis of bilateral carotid arteries: Secondary | ICD-10-CM | POA: Diagnosis not present

## 2023-04-08 DIAGNOSIS — I25118 Atherosclerotic heart disease of native coronary artery with other forms of angina pectoris: Secondary | ICD-10-CM

## 2023-04-08 DIAGNOSIS — E782 Mixed hyperlipidemia: Secondary | ICD-10-CM

## 2023-04-08 DIAGNOSIS — I10 Essential (primary) hypertension: Secondary | ICD-10-CM

## 2023-04-08 DIAGNOSIS — R0789 Other chest pain: Secondary | ICD-10-CM | POA: Diagnosis not present

## 2023-04-08 DIAGNOSIS — I2089 Other forms of angina pectoris: Secondary | ICD-10-CM

## 2023-04-08 NOTE — Progress Notes (Signed)
 Cardiology Office Note   Date:  04/08/2023   ID:  MINOR IDEN, DOB 08-12-1945, MRN [id number]  PCP:  System, Provider Not In  Cardiologist:  Denyse Bathe, MD      History of Present Illness: Scott Parker is a 78 y.o. male who presents for  Chief Complaint  Patient presents with   Follow-up    4 weeks CCTA results    Has left arm pain and chest pain  Chest Pain  This is a new problem. The current episode started more than 1 month ago. The onset quality is gradual. The problem occurs 2 to 4 times per day. The problem has been waxing and waning. The pain is at a severity of 4/10. The quality of the pain is described as heavy. Associated symptoms include shortness of breath. Pertinent negatives include no diaphoresis or dizziness.      Past Medical History:  Diagnosis Date   Arthritis    spinal column   COPD (chronic obstructive pulmonary disease) (HCC)    Coronary artery disease    Dyspnea    with exertion   Dysrhythmia    GERD (gastroesophageal reflux disease)    Gout    right foot   HOH (hard of hearing)    Hypercholesterolemia    Hyperlipemia    Hypertension    MRSA (methicillin resistant Staphylococcus aureus) 2016   HX of right lower leg   Wears dentures    full upper, partial lower     Past Surgical History:  Procedure Laterality Date   CARDIAC CATHETERIZATION  12/2019   CAROTID PTA/STENT INTERVENTION Left 06/28/2020   Procedure: CAROTID PTA/STENT INTERVENTION;  Surgeon: Jama Cordella MATSU, MD;  Location: [facility];  Service: Cardiovascular;  Laterality: Left;   CATARACT EXTRACTION W/PHACO Left 02/22/2020   Procedure: CATARACT EXTRACTION PHACO AND INTRAOCULAR LENS PLACEMENT (IOC) LEFT 4.45 00:41.2;  Surgeon: Jaye Fallow, MD;  Location: [facility];  Service: Ophthalmology;  Laterality: Left;   CATARACT EXTRACTION W/PHACO Right 03/14/2020   Procedure: CATARACT EXTRACTION PHACO AND INTRAOCULAR LENS PLACEMENT (IOC) RIGHT 6.06  00:50.4;  Surgeon: Jaye Fallow, MD;  Location: [facility];  Service: Ophthalmology;  Laterality: Right;   COLONOSCOPY     pt states he has had x 2   CORONARY STENT INTERVENTION N/A 11/03/2020   Procedure: CORONARY STENT INTERVENTION;  Surgeon: Darron Deatrice LABOR, MD;  Location: [facility];  Service: Cardiovascular;  Laterality: N/A;   fusion     cervical 3-7, repair ruptured disc   JOINT REPLACEMENT Bilateral    bilateral hip replacements   LEFT HEART CATH N/A 01/06/2020   Procedure: Left Heart Cath with possible intervention;  Surgeon: Bathe Denyse LABOR, MD;  Location: [facility];  Service: Cardiovascular;  Laterality: N/A;   LEFT HEART CATH AND CORONARY ANGIOGRAPHY Right 11/03/2020   Procedure: LEFT HEART CATH AND CORONARY ANGIOGRAPHY with Possible Interventon;  Surgeon: Bathe Denyse LABOR, MD;  Location: [facility];  Service: Cardiovascular;  Laterality: Right;   LEFT HEART CATH AND CORONARY ANGIOGRAPHY N/A 03/19/2022   Procedure: LEFT HEART CATH AND CORONARY ANGIOGRAPHY;  Surgeon: Bathe Denyse LABOR, MD;  Location: [facility];  Service: Cardiovascular;  Laterality: N/A;   TOTAL HIP ARTHROPLASTY     bilateral     Current Outpatient Medications  Medication Sig Dispense Refill   aspirin  EC 81 MG tablet Take 81 mg by mouth daily. Swallow whole.     atorvastatin  (  LIPITOR ) 80 MG tablet Take 1 tablet (80 mg total) by mouth every evening. 90 tablet 0   budesonide -formoterol  (SYMBICORT ) 160-4.5 MCG/ACT inhaler Inhale 1 puff into the lungs daily.     calcium  carbonate (TUMS - DOSED IN MG ELEMENTAL CALCIUM ) 500 MG chewable tablet Chew 500 mg by mouth daily as needed for indigestion or heartburn.     Calcium  Citrate (CITRACAL PO) Take 1 tablet by mouth daily.     CINNAMON PO Take 1,000 mg by mouth daily.     clopidogrel  (PLAVIX ) 75 MG tablet Take 1 tablet (75 mg total) by mouth daily. 90 tablet 0   ezetimibe (ZETIA) 10 MG tablet Take 10 mg by mouth daily.      GARLIC PO Take 1 tablet by mouth daily.     hydrochlorothiazide  (HYDRODIURIL ) 25 MG tablet Take 25 mg by mouth daily.     isosorbide  mononitrate (IMDUR ) 60 MG 24 hr tablet Take 1 tablet (60 mg total) by mouth 2 (two) times daily. 180 tablet 3   metoprolol  succinate (TOPROL  XL) 50 MG 24 hr tablet Take 1 tablet (50 mg total) by mouth daily. Take with or immediately following a meal. 30 tablet 11   Omega-3 Fatty Acids (FISH OIL) 1200 MG CAPS Take 1,200 mg by mouth daily.     pantoprazole  (PROTONIX ) 40 MG tablet Take 1 tablet (40 mg total) by mouth daily. 30 tablet 11   ranolazine  (RANEXA ) 1000 MG SR tablet Take 1 tablet (1,000 mg total) by mouth 2 (two) times daily. 60 tablet 2   zinc gluconate 50 MG tablet Take 50 mg by mouth 2 (two) times daily.     nitroGLYCERIN  (NITROSTAT ) 0.4 MG SL tablet Place 1 tablet (0.4 mg total) under the tongue every 5 (five) minutes as needed for chest pain. (Patient not taking: Reported on 04/08/2023) 100 tablet 3   No current facility-administered medications for this visit.    Allergies:   Benazepril-hydrochlorothiazide     Social History:   reports that he quit smoking about 27 years ago. His smoking use included cigarettes. He has never used smokeless tobacco. He reports current alcohol use of about 12.0 standard drinks of alcohol per week. He reports that he does not use drugs.   Family History:  family history includes Hypertension in his mother.    ROS:     Review of Systems  Constitutional: Negative.  Negative for diaphoresis.  HENT: Negative.    Eyes: Negative.   Respiratory:  Positive for shortness of breath.   Cardiovascular:  Positive for chest pain.  Gastrointestinal: Negative.   Genitourinary: Negative.   Musculoskeletal: Negative.   Skin: Negative.   Neurological: Negative.  Negative for dizziness.  Endo/Heme/Allergies: Negative.   Psychiatric/Behavioral: Negative.    All other systems reviewed and are negative.     All other systems  are reviewed and negative.    PHYSICAL EXAM: VS:  BP 122/68   Pulse 86   Ht 5' 10 (1.778 m)   Wt 182 lb (82.6 kg)   SpO2 96%   BMI 26.11 kg/m  , BMI Body mass index is 26.11 kg/m. Last weight:  Wt Readings from Last 3 Encounters:  04/08/23 182 lb (82.6 kg)  03/11/23 184 lb 12.8 oz (83.8 kg)  01/09/23 189 lb 6.4 oz (85.9 kg)     Physical Exam Vitals reviewed.  Constitutional:      Appearance: Normal appearance. He is normal weight.  HENT:     Head: Normocephalic.  Nose: Nose normal.     Mouth/Throat:     Mouth: Mucous membranes are moist.  Eyes:     Pupils: Pupils are equal, round, and reactive to light.  Cardiovascular:     Rate and Rhythm: Normal rate and regular rhythm.     Pulses: Normal pulses.     Heart sounds: Normal heart sounds.  Pulmonary:     Effort: Pulmonary effort is normal.  Abdominal:     General: Abdomen is flat. Bowel sounds are normal.  Musculoskeletal:        General: Normal range of motion.     Cervical back: Normal range of motion.  Skin:    General: Skin is warm.  Neurological:     General: No focal deficit present.     Mental Status: He is alert.  Psychiatric:        Mood and Affect: Mood normal.       EKG:   Recent Labs: 03/11/2023: BUN 18; Creatinine, Ser 1.02; Potassium 3.6; Sodium 141    Lipid Panel No results found for: CHOL, TRIG, HDL, CHOLHDL, VLDL, LDLCALC, LDLDIRECT    Other studies Reviewed: Additional studies/ records that were reviewed today include:  Review of the above records demonstrates:       No data to display            ASSESSMENT AND PLAN:    ICD-10-CM   1. Other chest pain  R07.89 MYOCARDIAL PERFUSION IMAGING   has anginal type chest pain radiating to left arm, advise stress test    2. 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 Coronary artery disease of native artery of native heart with stable angina pectoris (HCC)   I25.118 MYOCARDIAL PERFUSION IMAGING    4. Essential hypertension  I10 MYOCARDIAL PERFUSION IMAGING    5. Effort angina (HCC)  I20.89 MYOCARDIAL PERFUSION IMAGING    6. 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, bilateral   Relevant Orders   MYOCARDIAL PERFUSION IMAGING   CAD (coronary artery disease)   Relevant Orders   MYOCARDIAL PERFUSION IMAGING   Essential hypertension   Relevant Orders   MYOCARDIAL PERFUSION IMAGING   Effort angina (HCC)   Relevant Orders   MYOCARDIAL PERFUSION IMAGING     Other   Hyperlipidemia   Relevant Orders   MYOCARDIAL PERFUSION IMAGING   Other Visit Diagnoses       Other chest pain    -  Primary   has anginal type chest pain radiating to left arm, advise stress test   Relevant Orders   MYOCARDIAL PERFUSION IMAGING          Disposition:   Return in about 4 weeks (around 05/06/2023) for stress test and f/u.    Total time spent: 30 minutes  Signed,  Denyse Bathe, MD  04/08/2023 9:26 AM    [facility]

## 2023-04-15 ENCOUNTER — Ambulatory Visit (INDEPENDENT_AMBULATORY_CARE_PROVIDER_SITE_OTHER): Payer: Medicare Other

## 2023-04-15 DIAGNOSIS — I10 Essential (primary) hypertension: Secondary | ICD-10-CM

## 2023-04-15 DIAGNOSIS — I6523 Occlusion and stenosis of bilateral carotid arteries: Secondary | ICD-10-CM

## 2023-04-15 DIAGNOSIS — I2089 Other forms of angina pectoris: Secondary | ICD-10-CM

## 2023-04-15 DIAGNOSIS — R0789 Other chest pain: Secondary | ICD-10-CM

## 2023-04-15 DIAGNOSIS — I25118 Atherosclerotic heart disease of native coronary artery with other forms of angina pectoris: Secondary | ICD-10-CM

## 2023-04-15 DIAGNOSIS — E782 Mixed hyperlipidemia: Secondary | ICD-10-CM

## 2023-04-16 MED ORDER — TECHNETIUM TC 99M SESTAMIBI GENERIC - CARDIOLITE
10.4000 | Freq: Once | INTRAVENOUS | Status: AC | PRN
  Administered 2023-04-15: 10.4 via INTRAVENOUS

## 2023-04-16 MED ORDER — TECHNETIUM TC 99M SESTAMIBI GENERIC - CARDIOLITE
33.1000 | Freq: Once | INTRAVENOUS | Status: AC | PRN
  Administered 2023-04-15: 33.1 via INTRAVENOUS

## 2023-04-18 ENCOUNTER — Ambulatory Visit (INDEPENDENT_AMBULATORY_CARE_PROVIDER_SITE_OTHER): Payer: Medicare Other | Admitting: Cardiovascular Disease

## 2023-04-18 ENCOUNTER — Encounter: Payer: Self-pay | Admitting: Cardiovascular Disease

## 2023-04-18 ENCOUNTER — Ambulatory Visit: Payer: Self-pay | Admitting: Cardiovascular Disease

## 2023-04-18 VITALS — BP 118/72 | HR 78 | Ht 70.0 in | Wt 185.0 lb

## 2023-04-18 DIAGNOSIS — I10 Essential (primary) hypertension: Secondary | ICD-10-CM

## 2023-04-18 DIAGNOSIS — I259 Chronic ischemic heart disease, unspecified: Secondary | ICD-10-CM | POA: Insufficient documentation

## 2023-04-18 DIAGNOSIS — R0789 Other chest pain: Secondary | ICD-10-CM

## 2023-04-18 DIAGNOSIS — T82855A Stenosis of coronary artery stent, initial encounter: Secondary | ICD-10-CM | POA: Insufficient documentation

## 2023-04-18 DIAGNOSIS — R9439 Abnormal result of other cardiovascular function study: Secondary | ICD-10-CM

## 2023-04-18 DIAGNOSIS — I200 Unstable angina: Secondary | ICD-10-CM

## 2023-04-18 DIAGNOSIS — I25118 Atherosclerotic heart disease of native coronary artery with other forms of angina pectoris: Secondary | ICD-10-CM | POA: Diagnosis not present

## 2023-04-18 DIAGNOSIS — R072 Precordial pain: Secondary | ICD-10-CM | POA: Insufficient documentation

## 2023-04-18 NOTE — Progress Notes (Signed)
Cardiology Office Note   Date:  04/18/2023   ID:  Scott Parker, DOB 05-24-1945, MRN [id number]  PCP:  System, Provider Not In  Cardiologist:  Adrian Blackwater, MD      History of Present Illness: Scott Parker is a 78 y.o. male who presents for  Chief Complaint  Patient presents with   Follow-up    Chest Pain  This is a recurrent problem. The current episode started 1 to 4 weeks ago. The problem occurs intermittently. The pain is present in the substernal region. The pain is at a severity of 4/10. The pain is moderate. The quality of the pain is described as tightness.      Past Medical History:  Diagnosis Date   Arthritis    spinal column   COPD (chronic obstructive pulmonary disease) (HCC)    Coronary artery disease    Dyspnea    with exertion   Dysrhythmia    GERD (gastroesophageal reflux disease)    Gout    right foot   HOH (hard of hearing)    Hypercholesterolemia    Hyperlipemia    Hypertension    MRSA (methicillin resistant Staphylococcus aureus) 2016   HX of right lower leg   Wears dentures    full upper, partial lower     Past Surgical History:  Procedure Laterality Date   CARDIAC CATHETERIZATION  12/2019   CAROTID PTA/STENT INTERVENTION Left 06/28/2020   Procedure: CAROTID PTA/STENT INTERVENTION;  Surgeon: Renford Dills, MD;  Location: [facility];  Service: Cardiovascular;  Laterality: Left;   CATARACT EXTRACTION W/PHACO Left 02/22/2020   Procedure: CATARACT EXTRACTION PHACO AND INTRAOCULAR LENS PLACEMENT (IOC) LEFT 4.45 00:41.2;  Surgeon: Galen Manila, MD;  Location: [facility];  Service: Ophthalmology;  Laterality: Left;   CATARACT EXTRACTION W/PHACO Right 03/14/2020   Procedure: CATARACT EXTRACTION PHACO AND INTRAOCULAR LENS PLACEMENT (IOC) RIGHT 6.06 00:50.4;  Surgeon: Galen Manila, MD;  Location: [facility];  Service: Ophthalmology;  Laterality: Right;   COLONOSCOPY     pt states he has had x 2    CORONARY STENT INTERVENTION N/A 11/03/2020   Procedure: CORONARY STENT INTERVENTION;  Surgeon: Iran Ouch, MD;  Location: [facility];  Service: Cardiovascular;  Laterality: N/A;   fusion     cervical 3-7, repair ruptured disc   JOINT REPLACEMENT Bilateral    bilateral hip replacements   LEFT HEART CATH N/A 01/06/2020   Procedure: Left Heart Cath with possible intervention;  Surgeon: Laurier Nancy, MD;  Location: [facility];  Service: Cardiovascular;  Laterality: N/A;   LEFT HEART CATH AND CORONARY ANGIOGRAPHY Right 11/03/2020   Procedure: LEFT HEART CATH AND CORONARY ANGIOGRAPHY with Possible Interventon;  Surgeon: Laurier Nancy, MD;  Location: [facility];  Service: Cardiovascular;  Laterality: Right;   LEFT HEART CATH AND CORONARY ANGIOGRAPHY N/A 03/19/2022   Procedure: LEFT HEART CATH AND CORONARY ANGIOGRAPHY;  Surgeon: Laurier Nancy, MD;  Location: [facility];  Service: Cardiovascular;  Laterality: N/A;   TOTAL HIP ARTHROPLASTY     bilateral     Current Outpatient Medications  Medication Sig Dispense Refill   aspirin EC 81 MG tablet Take 81 mg by mouth daily. Swallow whole.     atorvastatin (LIPITOR) 80 MG tablet Take 1 tablet (80 mg total) by mouth every evening. 90 tablet 0   budesonide-formoterol (SYMBICORT) 160-4.5 MCG/ACT inhaler Inhale 1 puff into the lungs daily.  calcium carbonate (TUMS - DOSED IN MG ELEMENTAL CALCIUM) 500 MG chewable tablet Chew 500 mg by mouth daily as needed for indigestion or heartburn.     Calcium Citrate (CITRACAL PO) Take 1 tablet by mouth daily.     CINNAMON PO Take 1,000 mg by mouth daily.     clopidogrel (PLAVIX) 75 MG tablet Take 1 tablet (75 mg total) by mouth daily. 90 tablet 0   ezetimibe (ZETIA) 10 MG tablet Take 10 mg by mouth daily.     GARLIC PO Take 1 tablet by mouth daily.     hydrochlorothiazide (HYDRODIURIL) 25 MG tablet Take 25 mg by mouth daily.     isosorbide mononitrate (IMDUR) 60 MG  24 hr tablet Take 1 tablet (60 mg total) by mouth 2 (two) times daily. 180 tablet 3   metoprolol succinate (TOPROL XL) 50 MG 24 hr tablet Take 1 tablet (50 mg total) by mouth daily. Take with or immediately following a meal. 30 tablet 11   nitroGLYCERIN (NITROSTAT) 0.4 MG SL tablet Place 1 tablet (0.4 mg total) under the tongue every 5 (five) minutes as needed for chest pain. (Patient not taking: Reported on 04/08/2023) 100 tablet 3   Omega-3 Fatty Acids (FISH OIL) 1200 MG CAPS Take 1,200 mg by mouth daily.     pantoprazole (PROTONIX) 40 MG tablet Take 1 tablet (40 mg total) by mouth daily. 30 tablet 11   ranolazine (RANEXA) 1000 MG SR tablet Take 1 tablet (1,000 mg total) by mouth 2 (two) times daily. 60 tablet 2   zinc gluconate 50 MG tablet Take 50 mg by mouth 2 (two) times daily.     No current facility-administered medications for this visit.    Allergies:   Benazepril-hydrochlorothiazide    Social History:   reports that he quit smoking about 27 years ago. His smoking use included cigarettes. He has never used smokeless tobacco. He reports current alcohol use of about 12.0 standard drinks of alcohol per week. He reports that he does not use drugs.   Family History:  family history includes Hypertension in his mother.    ROS:     Review of Systems  Constitutional: Negative.   HENT: Negative.    Eyes: Negative.   Respiratory: Negative.    Cardiovascular:  Positive for chest pain.  Gastrointestinal: Negative.   Genitourinary: Negative.   Musculoskeletal: Negative.   Skin: Negative.   Neurological: Negative.   Endo/Heme/Allergies: Negative.   Psychiatric/Behavioral: Negative.    All other systems reviewed and are negative.     All other systems are reviewed and negative.    PHYSICAL EXAM: VS:  BP 118/72   Pulse 78   Ht 5\' 10"  (1.778 m)   Wt 185 lb (83.9 kg)   SpO2 98%   BMI 26.54 kg/m  , BMI Body mass index is 26.54 kg/m. Last weight:  Wt Readings from Last 3  Encounters:  04/18/23 185 lb (83.9 kg)  04/08/23 182 lb (82.6 kg)  03/11/23 184 lb 12.8 oz (83.8 kg)     Physical Exam Vitals reviewed.  Constitutional:      Appearance: Normal appearance. He is normal weight.  HENT:     Head: Normocephalic.     Nose: Nose normal.     Mouth/Throat:     Mouth: Mucous membranes are moist.  Eyes:     Pupils: Pupils are equal, round, and reactive to light.  Cardiovascular:     Rate and Rhythm: Normal rate and regular rhythm.  Pulses: Normal pulses.     Heart sounds: Normal heart sounds.  Pulmonary:     Effort: Pulmonary effort is normal.  Abdominal:     General: Abdomen is flat. Bowel sounds are normal.  Musculoskeletal:        General: Normal range of motion.     Cervical back: Normal range of motion.  Skin:    General: Skin is warm.  Neurological:     General: No focal deficit present.     Mental Status: He is alert.  Psychiatric:        Mood and Affect: Mood normal.       EKG:   Recent Labs: 03/11/2023: BUN 18; Creatinine, Ser 1.02; Potassium 3.6; Sodium 141    Lipid Panel No results found for: "CHOL", "TRIG", "HDL", "CHOLHDL", "VLDL", "LDLCALC", "LDLDIRECT"    Other studies Reviewed: Additional studies/ records that were reviewed today include:  Review of the above records demonstrates:       No data to display            ASSESSMENT AND PLAN:    ICD-10-CM   1. Precordial pain  R07.2 Procedural/ Surgical Case Request: LEFT HEART CATH AND CORONARY ANGIOGRAPHY with coronary intervention    Comprehensive metabolic panel    CBC with Differential/Platelet   stress test showed inferior ischaemia, normal LVEF. Advise cardiac cath as continues to have chest pains.    2. Chest pain due to myocardial ischemia, unspecified ischemic chest pain type  I25.9 Procedural/ Surgical Case Request: LEFT HEART CATH AND CORONARY ANGIOGRAPHY with coronary intervention    Comprehensive metabolic panel    CBC with  Differential/Platelet    3. Coronary stent restenosis, initial encounter  T82.855A Procedural/ Surgical Case Request: LEFT HEART CATH AND CORONARY ANGIOGRAPHY with coronary intervention    Comprehensive metabolic panel    CBC with Differential/Platelet    4. Essential hypertension  I10 Procedural/ Surgical Case Request: LEFT HEART CATH AND CORONARY ANGIOGRAPHY with coronary intervention    Comprehensive metabolic panel    CBC with Differential/Platelet    5. Coronary artery disease of native artery of native heart with stable angina pectoris (HCC)  I25.118 Procedural/ Surgical Case Request: LEFT HEART CATH AND CORONARY ANGIOGRAPHY with coronary intervention    Comprehensive metabolic panel    CBC with Differential/Platelet   H/O of PCI stenti g wioth inferior ischaemia stress test will do cath    6. Unstable angina (HCC)  I20.0 Procedural/ Surgical Case Request: LEFT HEART CATH AND CORONARY ANGIOGRAPHY with coronary intervention    Comprehensive metabolic panel    CBC with Differential/Platelet    7. Abnormal nuclear stress test  R94.39 Procedural/ Surgical Case Request: LEFT HEART CATH AND CORONARY ANGIOGRAPHY with coronary intervention    Comprehensive metabolic panel    CBC with Differential/Platelet   Inferior ischaemia on  nuclear stress test    8. Precordial pain  R07.2 Procedural/ Surgical Case Request: LEFT HEART CATH AND CORONARY ANGIOGRAPHY with coronary intervention    Comprehensive metabolic panel    CBC with Differential/Platelet    9. Abnormal nuclear stress test  R94.39 Procedural/ Surgical Case Request: LEFT HEART CATH AND CORONARY ANGIOGRAPHY with coronary intervention    Comprehensive metabolic panel    CBC with Differential/Platelet       Problem List Items Addressed This Visit       Cardiovascular and Mediastinum   CAD (coronary artery disease)   Relevant Orders   Procedural/ Surgical Case Request: LEFT HEART CATH AND CORONARY  ANGIOGRAPHY with coronary  intervention (Completed)   Comprehensive metabolic panel   CBC with Differential/Platelet   Essential hypertension   Relevant Orders   Procedural/ Surgical Case Request: LEFT HEART CATH AND CORONARY ANGIOGRAPHY with coronary intervention (Completed)   Comprehensive metabolic panel   CBC with Differential/Platelet   Unstable angina [facility])   Relevant Orders   Procedural/ Surgical Case Request: LEFT HEART CATH AND CORONARY ANGIOGRAPHY with coronary intervention (Completed)   Comprehensive metabolic panel   CBC with Differential/Platelet     Other   Precordial pain - Primary   Relevant Orders   Procedural/ Surgical Case Request: LEFT HEART CATH AND CORONARY ANGIOGRAPHY with coronary intervention (Completed)   Comprehensive metabolic panel   CBC with Differential/Platelet   Chest pain due to myocardial ischemia   Relevant Orders   Procedural/ Surgical Case Request: LEFT HEART CATH AND CORONARY ANGIOGRAPHY with coronary intervention (Completed)   Comprehensive metabolic panel   CBC with Differential/Platelet   Coronary stent restenosis   Relevant Orders   Procedural/ Surgical Case Request: LEFT HEART CATH AND CORONARY ANGIOGRAPHY with coronary intervention (Completed)   Comprehensive metabolic panel   CBC with Differential/Platelet   Abnormal nuclear stress test   Relevant Orders   Procedural/ Surgical Case Request: LEFT HEART CATH AND CORONARY ANGIOGRAPHY with coronary intervention (Completed)   Comprehensive metabolic panel   CBC with Differential/Platelet       Disposition:   Return in about 2 weeks (around 05/02/2023) for  and f/u.    Total time spent: 50 minutes  Signed,  Adrian Blackwater, MD  04/18/2023 11:05 AM    [facility]

## 2023-04-19 LAB — CBC WITH DIFFERENTIAL/PLATELET
Basophils Absolute: 0 10*3/uL (ref 0.0–0.2)
Basos: 1 %
EOS (ABSOLUTE): 0.2 10*3/uL (ref 0.0–0.4)
Eos: 3 %
Hematocrit: 44.5 % (ref 37.5–51.0)
Hemoglobin: 15 g/dL (ref 13.0–17.7)
Immature Grans (Abs): 0 10*3/uL (ref 0.0–0.1)
Immature Granulocytes: 0 %
Lymphocytes Absolute: 2 10*3/uL (ref 0.7–3.1)
Lymphs: 25 %
MCH: 32.4 pg (ref 26.6–33.0)
MCHC: 33.7 g/dL (ref 31.5–35.7)
MCV: 96 fL (ref 79–97)
Monocytes Absolute: 0.9 10*3/uL (ref 0.1–0.9)
Monocytes: 11 %
Neutrophils Absolute: 4.9 10*3/uL (ref 1.4–7.0)
Neutrophils: 60 %
Platelets: 237 10*3/uL (ref 150–450)
RBC: 4.63 x10E6/uL (ref 4.14–5.80)
RDW: 12.6 % (ref 11.6–15.4)
WBC: 8 10*3/uL (ref 3.4–10.8)

## 2023-04-19 LAB — COMPREHENSIVE METABOLIC PANEL
ALT: 27 [IU]/L (ref 0–44)
AST: 25 [IU]/L (ref 0–40)
Albumin: 4.2 g/dL (ref 3.8–4.8)
Alkaline Phosphatase: 111 [IU]/L (ref 44–121)
BUN/Creatinine Ratio: 13 (ref 10–24)
BUN: 14 mg/dL (ref 8–27)
Bilirubin Total: 0.7 mg/dL (ref 0.0–1.2)
CO2: 23 mmol/L (ref 20–29)
Calcium: 9.1 mg/dL (ref 8.6–10.2)
Chloride: 99 mmol/L (ref 96–106)
Creatinine, Ser: 1.04 mg/dL (ref 0.76–1.27)
Globulin, Total: 2.3 g/dL (ref 1.5–4.5)
Glucose: 140 mg/dL — ABNORMAL HIGH (ref 70–99)
Potassium: 3.9 mmol/L (ref 3.5–5.2)
Sodium: 141 mmol/L (ref 134–144)
Total Protein: 6.5 g/dL (ref 6.0–8.5)
eGFR: 74 mL/min/{1.73_m2} (ref >59)

## 2023-04-24 ENCOUNTER — Other Ambulatory Visit: Payer: Self-pay

## 2023-04-24 ENCOUNTER — Encounter
Admission: RE | Disposition: A | Discharge status: Home / Self Care | Payer: Self-pay | Source: Home / Self Care | Attending: Cardiovascular Disease

## 2023-04-24 ENCOUNTER — Ambulatory Visit
Admission: RE | Admit: 2023-04-24 | Discharge: 2023-04-24 | Disposition: A | Discharge status: Home / Self Care | Payer: Medicare Other | Attending: Cardiovascular Disease | Admitting: Cardiovascular Disease

## 2023-04-24 ENCOUNTER — Encounter: Payer: Self-pay | Admitting: Cardiovascular Disease

## 2023-04-24 DIAGNOSIS — I259 Chronic ischemic heart disease, unspecified: Secondary | ICD-10-CM | POA: Insufficient documentation

## 2023-04-24 DIAGNOSIS — Z955 Presence of coronary angioplasty implant and graft: Secondary | ICD-10-CM | POA: Diagnosis not present

## 2023-04-24 DIAGNOSIS — R072 Precordial pain: Secondary | ICD-10-CM | POA: Insufficient documentation

## 2023-04-24 DIAGNOSIS — I2510 Atherosclerotic heart disease of native coronary artery without angina pectoris: Secondary | ICD-10-CM | POA: Diagnosis not present

## 2023-04-24 DIAGNOSIS — Z7902 Long term (current) use of antithrombotics/antiplatelets: Secondary | ICD-10-CM | POA: Diagnosis not present

## 2023-04-24 DIAGNOSIS — I25118 Atherosclerotic heart disease of native coronary artery with other forms of angina pectoris: Secondary | ICD-10-CM | POA: Diagnosis not present

## 2023-04-24 DIAGNOSIS — I359 Nonrheumatic aortic valve disorder, unspecified: Secondary | ICD-10-CM | POA: Diagnosis not present

## 2023-04-24 DIAGNOSIS — Z7982 Long term (current) use of aspirin: Secondary | ICD-10-CM | POA: Insufficient documentation

## 2023-04-24 DIAGNOSIS — R9439 Abnormal result of other cardiovascular function study: Secondary | ICD-10-CM

## 2023-04-24 DIAGNOSIS — T82855A Stenosis of coronary artery stent, initial encounter: Secondary | ICD-10-CM

## 2023-04-24 DIAGNOSIS — I200 Unstable angina: Secondary | ICD-10-CM | POA: Insufficient documentation

## 2023-04-24 DIAGNOSIS — I10 Essential (primary) hypertension: Secondary | ICD-10-CM | POA: Insufficient documentation

## 2023-04-24 DIAGNOSIS — R0789 Other chest pain: Secondary | ICD-10-CM

## 2023-04-24 HISTORY — PX: LEFT HEART CATH AND CORONARY ANGIOGRAPHY: CATH118249

## 2023-04-24 SURGERY — LEFT HEART CATH AND CORONARY ANGIOGRAPHY
Anesthesia: Moderate Sedation | Laterality: Left

## 2023-04-24 MED ORDER — ONDANSETRON HCL 4 MG/2ML IJ SOLN: 4.0000 mg | Freq: Four times a day (QID) | INTRAMUSCULAR | Status: DC | PRN

## 2023-04-24 MED ORDER — SODIUM CHLORIDE 0.9 % IV SOLN: 250.0000 mL | INTRAVENOUS | Status: DC | PRN

## 2023-04-24 MED ORDER — ASPIRIN 81 MG PO CHEW: 81.0000 mg | CHEWABLE_TABLET | ORAL | Status: DC

## 2023-04-24 MED ORDER — SODIUM CHLORIDE 0.9 % WEIGHT BASED INFUSION: 1.0000 mL/kg/h | INTRAVENOUS | Status: DC

## 2023-04-24 MED ORDER — MIDAZOLAM HCL 2 MG/2ML IJ SOLN
INTRAMUSCULAR | Status: AC
  Filled 2023-04-24: qty 2

## 2023-04-24 MED ORDER — FENTANYL CITRATE (PF) 100 MCG/2ML IJ SOLN
INTRAMUSCULAR | Status: AC
  Filled 2023-04-24: qty 2

## 2023-04-24 MED ORDER — SODIUM CHLORIDE 0.9% FLUSH: 3.0000 mL | INTRAVENOUS | Status: DC | PRN

## 2023-04-24 MED ORDER — FENTANYL CITRATE (PF) 100 MCG/2ML IJ SOLN
INTRAMUSCULAR | Status: DC | PRN
  Administered 2023-04-24: 25 ug via INTRAVENOUS

## 2023-04-24 MED ORDER — SODIUM CHLORIDE 0.9% FLUSH: 3.0000 mL | Freq: Two times a day (BID) | INTRAVENOUS | Status: DC

## 2023-04-24 MED ORDER — LIDOCAINE HCL 1 % IJ SOLN
INTRAMUSCULAR | Status: AC
  Filled 2023-04-24: qty 20

## 2023-04-24 MED ORDER — HEPARIN (PORCINE) IN NACL 1000-0.9 UT/500ML-% IV SOLN
INTRAVENOUS | Status: AC
  Filled 2023-04-24: qty 1000

## 2023-04-24 MED ORDER — HEPARIN (PORCINE) IN NACL 1000-0.9 UT/500ML-% IV SOLN
INTRAVENOUS | Status: DC | PRN
  Administered 2023-04-24 (×2): 500 mL

## 2023-04-24 MED ORDER — IOHEXOL 300 MG/ML  SOLN
INTRAMUSCULAR | Status: DC | PRN
  Administered 2023-04-24: 90 mL

## 2023-04-24 MED ORDER — SODIUM CHLORIDE 0.9 % WEIGHT BASED INFUSION
3.0000 mL/kg/h | INTRAVENOUS | Status: DC
  Administered 2023-04-24: 3 mL/kg/h via INTRAVENOUS

## 2023-04-24 MED ORDER — HYDRALAZINE HCL 20 MG/ML IJ SOLN: 10.0000 mg | INTRAMUSCULAR | Status: DC | PRN

## 2023-04-24 MED ORDER — ACETAMINOPHEN 325 MG PO TABS: 650.0000 mg | ORAL_TABLET | ORAL | Status: DC | PRN

## 2023-04-24 MED ORDER — LABETALOL HCL 5 MG/ML IV SOLN: 10.0000 mg | INTRAVENOUS | Status: DC | PRN

## 2023-04-24 MED ORDER — MIDAZOLAM HCL 2 MG/2ML IJ SOLN
INTRAMUSCULAR | Status: DC | PRN
  Administered 2023-04-24: 1 mg via INTRAVENOUS

## 2023-04-24 MED ORDER — LIDOCAINE HCL (PF) 1 % IJ SOLN
INTRAMUSCULAR | Status: DC | PRN
  Administered 2023-04-24: 20 mL

## 2023-04-24 SURGICAL SUPPLY — 11 items
CATH INFINITI 5FR JL5 (CATHETERS) IMPLANT
CATH INFINITI 5FR MULTPACK ANG (CATHETERS) IMPLANT
DEVICE CLOSURE MYNXGRIP 5F (Vascular Products) IMPLANT
NDL PERC 18GX7CM (NEEDLE) IMPLANT
NEEDLE PERC 18GX7CM (NEEDLE) ×1
PACK CARDIAC CATH (CUSTOM PROCEDURE TRAY) ×1 IMPLANT
PROTECTION STATION PRESSURIZED (MISCELLANEOUS) ×1
SET ATX-X65L (MISCELLANEOUS) IMPLANT
SHEATH AVANTI 5FR X 11CM (SHEATH) IMPLANT
STATION PROTECTION PRESSURIZED (MISCELLANEOUS) IMPLANT
WIRE GUIDERIGHT .035X150 (WIRE) IMPLANT

## 2023-04-24 NOTE — Discharge Instructions (Signed)

## 2023-04-25 ENCOUNTER — Encounter: Payer: Self-pay | Admitting: Cardiovascular Disease

## 2023-04-28 ENCOUNTER — Encounter: Payer: Self-pay | Admitting: Cardiovascular Disease

## 2023-04-28 ENCOUNTER — Ambulatory Visit (INDEPENDENT_AMBULATORY_CARE_PROVIDER_SITE_OTHER): Payer: Medicare Other | Admitting: Cardiovascular Disease

## 2023-04-28 VITALS — BP 123/71 | HR 77 | Ht 70.0 in | Wt 182.2 lb

## 2023-04-28 DIAGNOSIS — I25118 Atherosclerotic heart disease of native coronary artery with other forms of angina pectoris: Secondary | ICD-10-CM | POA: Diagnosis not present

## 2023-04-28 DIAGNOSIS — I6523 Occlusion and stenosis of bilateral carotid arteries: Secondary | ICD-10-CM

## 2023-04-28 DIAGNOSIS — T82855D Stenosis of coronary artery stent, subsequent encounter: Secondary | ICD-10-CM

## 2023-04-28 DIAGNOSIS — I10 Essential (primary) hypertension: Secondary | ICD-10-CM | POA: Diagnosis not present

## 2023-04-28 DIAGNOSIS — K219 Gastro-esophageal reflux disease without esophagitis: Secondary | ICD-10-CM | POA: Diagnosis not present

## 2023-04-28 DIAGNOSIS — R072 Precordial pain: Secondary | ICD-10-CM

## 2023-04-28 NOTE — Progress Notes (Signed)
Cardiology Office Note   Date:  04/28/2023   ID:  Scott Parker, DOB 1945-05-17, MRN [id number]  PCP:  System, Provider Not In  Cardiologist:  Adrian Blackwater, MD      History of Present Illness: Scott Parker is a 78 y.o. male who presents for No chief complaint on file.   Came for f/u after cardiac cath, no complaints      Past Medical History:  Diagnosis Date   Arthritis    spinal column   COPD (chronic obstructive pulmonary disease) (HCC)    Coronary artery disease    Dyspnea    with exertion   Dysrhythmia    GERD (gastroesophageal reflux disease)    Gout    right foot   HOH (hard of hearing)    Hypercholesterolemia    Hyperlipemia    Hypertension    MRSA (methicillin resistant Staphylococcus aureus) 2016   HX of right lower leg   Wears dentures    full upper, partial lower     Past Surgical History:  Procedure Laterality Date   CARDIAC CATHETERIZATION  12/2019   CAROTID PTA/STENT INTERVENTION Left 06/28/2020   Procedure: CAROTID PTA/STENT INTERVENTION;  Surgeon: Renford Dills, MD;  Location: [facility];  Service: Cardiovascular;  Laterality: Left;   CATARACT EXTRACTION W/PHACO Left 02/22/2020   Procedure: CATARACT EXTRACTION PHACO AND INTRAOCULAR LENS PLACEMENT (IOC) LEFT 4.45 00:41.2;  Surgeon: Galen Manila, MD;  Location: [facility];  Service: Ophthalmology;  Laterality: Left;   CATARACT EXTRACTION W/PHACO Right 03/14/2020   Procedure: CATARACT EXTRACTION PHACO AND INTRAOCULAR LENS PLACEMENT (IOC) RIGHT 6.06 00:50.4;  Surgeon: Galen Manila, MD;  Location: [facility];  Service: Ophthalmology;  Laterality: Right;   COLONOSCOPY     pt states he has had x 2   CORONARY STENT INTERVENTION N/A 11/03/2020   Procedure: CORONARY STENT INTERVENTION;  Surgeon: Iran Ouch, MD;  Location: [facility];  Service: Cardiovascular;  Laterality: N/A;   fusion     cervical 3-7, repair ruptured disc   JOINT  REPLACEMENT Bilateral    bilateral hip replacements   LEFT HEART CATH N/A 01/06/2020   Procedure: Left Heart Cath with possible intervention;  Surgeon: Laurier Nancy, MD;  Location: [facility];  Service: Cardiovascular;  Laterality: N/A;   LEFT HEART CATH AND CORONARY ANGIOGRAPHY Right 11/03/2020   Procedure: LEFT HEART CATH AND CORONARY ANGIOGRAPHY with Possible Interventon;  Surgeon: Laurier Nancy, MD;  Location: [facility];  Service: Cardiovascular;  Laterality: Right;   LEFT HEART CATH AND CORONARY ANGIOGRAPHY N/A 03/19/2022   Procedure: LEFT HEART CATH AND CORONARY ANGIOGRAPHY;  Surgeon: Laurier Nancy, MD;  Location: [facility];  Service: Cardiovascular;  Laterality: N/A;   LEFT HEART CATH AND CORONARY ANGIOGRAPHY Left 04/24/2023   Procedure: LEFT HEART CATH AND CORONARY ANGIOGRAPHY with coronary intervention;  Surgeon: Laurier Nancy, MD;  Location: [facility];  Service: Cardiovascular;  Laterality: Left;   TOTAL HIP ARTHROPLASTY     bilateral     Current Outpatient Medications  Medication Sig Dispense Refill   aspirin EC 81 MG tablet Take 81 mg by mouth daily. Swallow whole.     atorvastatin (LIPITOR) 80 MG tablet Take 1 tablet (80 mg total) by mouth every evening. 90 tablet 0   budesonide-formoterol (SYMBICORT) 160-4.5 MCG/ACT inhaler Inhale 1 puff into the lungs daily.     Calcium Carb-Cholecalciferol (CALCIUM + D3 PO)  Take 1 tablet by mouth daily.     calcium carbonate (TUMS - DOSED IN MG ELEMENTAL CALCIUM) 500 MG chewable tablet Chew 1,500-2,000 mg by mouth daily as needed for indigestion or heartburn.     CINNAMON PO Take 1,000 mg by mouth daily.     clopidogrel (PLAVIX) 75 MG tablet Take 1 tablet (75 mg total) by mouth daily. 90 tablet 0   ezetimibe (ZETIA) 10 MG tablet Take 10 mg by mouth daily.     GARLIC PO Take 1,610 mg by mouth daily.     hydrochlorothiazide (HYDRODIURIL) 25 MG tablet Take 25 mg by mouth daily.     isosorbide  mononitrate (IMDUR) 60 MG 24 hr tablet Take 1 tablet (60 mg total) by mouth 2 (two) times daily. 180 tablet 3   metoprolol succinate (TOPROL XL) 50 MG 24 hr tablet Take 1 tablet (50 mg total) by mouth daily. Take with or immediately following a meal. 30 tablet 11   nitroGLYCERIN (NITROSTAT) 0.4 MG SL tablet Place 1 tablet (0.4 mg total) under the tongue every 5 (five) minutes as needed for chest pain. 100 tablet 3   Omega-3 Fatty Acids (FISH OIL) 1200 MG CAPS Take 1,200 mg by mouth daily.     pantoprazole (PROTONIX) 40 MG tablet Take 1 tablet (40 mg total) by mouth daily. 30 tablet 11   Polyvinyl Alcohol-Povidone (REFRESH OP) Place 1 drop into both eyes 3 (three) times daily.     ranolazine (RANEXA) 1000 MG SR tablet Take 1 tablet (1,000 mg total) by mouth 2 (two) times daily. 60 tablet 2   White Petrolatum-Mineral Oil (SYSTANE NIGHTTIME) OINT Place 1 Application into both eyes at bedtime.     zinc gluconate 50 MG tablet Take 50 mg by mouth 2 (two) times daily.     No current facility-administered medications for this visit.    Allergies:   Benazepril-hydrochlorothiazide    Social History:   reports that he quit smoking about 27 years ago. His smoking use included cigarettes. He has never used smokeless tobacco. He reports current alcohol use of about 12.0 standard drinks of alcohol per week. He reports that he does not use drugs.   Family History:  family history includes Hypertension in his mother.    ROS:     Review of Systems  Constitutional: Negative.   HENT: Negative.    Eyes: Negative.   Respiratory: Negative.    Gastrointestinal: Negative.   Genitourinary: Negative.   Musculoskeletal: Negative.   Skin: Negative.   Neurological: Negative.   Endo/Heme/Allergies: Negative.   Psychiatric/Behavioral: Negative.    All other systems reviewed and are negative.     All other systems are reviewed and negative.    PHYSICAL EXAM: VS:  BP 123/71   Pulse 77   Ht 5\' 10"  (1.778 m)    Wt 182 lb 3.2 oz (82.6 kg)   SpO2 98%   BMI 26.14 kg/m  , BMI Body mass index is 26.14 kg/m. Last weight:  Wt Readings from Last 3 Encounters:  04/28/23 182 lb 3.2 oz (82.6 kg)  04/24/23 183 lb 1.6 oz (83.1 kg)  04/18/23 185 lb (83.9 kg)     Physical Exam Vitals reviewed.  Constitutional:      Appearance: Normal appearance. He is normal weight.  HENT:     Head: Normocephalic.     Nose: Nose normal.     Mouth/Throat:     Mouth: Mucous membranes are moist.  Eyes:     Pupils:  Pupils are equal, round, and reactive to light.  Cardiovascular:     Rate and Rhythm: Normal rate and regular rhythm.     Pulses: Normal pulses.     Heart sounds: Normal heart sounds.  Pulmonary:     Effort: Pulmonary effort is normal.  Abdominal:     General: Abdomen is flat. Bowel sounds are normal.  Musculoskeletal:        General: Normal range of motion.     Cervical back: Normal range of motion.  Skin:    General: Skin is warm.  Neurological:     General: No focal deficit present.     Mental Status: He is alert.  Psychiatric:        Mood and Affect: Mood normal.       EKG:   Recent Labs: 04/18/2023: ALT 27; BUN 14; Creatinine, Ser 1.04; Hemoglobin 15.0; Platelets 237; Potassium 3.9; Sodium 141    Lipid Panel No results found for: "CHOL", "TRIG", "HDL", "CHOLHDL", "VLDL", "LDLCALC", "LDLDIRECT"    Other studies Reviewed: Additional studies/ records that were reviewed today include:  Review of the above records demonstrates:       No data to display            ASSESSMENT AND PLAN:    ICD-10-CM   1. 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 Coronary artery disease of native artery of native heart with stable angina pectoris (HCC)  I25.118     3. Essential hypertension  I10     4. Gastroesophageal reflux disease, unspecified whether esophagitis present  K21.9    Take mylanta if still have pain as on oprotonix    5. Precordial pain  R07.2     has probably GERD as cause of chest pain as mild CAD and stent in proximal RCA ok    6. 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    Total time spent: 30 minutes  Signed,  Adrian Blackwater, MD  04/28/2023 11:43 AM    [facility]

## 2023-05-29 ENCOUNTER — Other Ambulatory Visit (INDEPENDENT_AMBULATORY_CARE_PROVIDER_SITE_OTHER): Payer: Self-pay | Admitting: Vascular Surgery

## 2023-05-29 DIAGNOSIS — I6523 Occlusion and stenosis of bilateral carotid arteries: Secondary | ICD-10-CM

## 2023-06-01 NOTE — Progress Notes (Unsigned)
 MRN : [id number]  Scott Parker is a 78 y.o. ([birth year]) male who presents with chief complaint of check carotid arteries.  History of Present Illness:   The patient is seen for follow up evaluation of carotid stenosis. The carotid stenosis followed by ultrasound.    The patient denies amaurosis fugax. There is no recent history of TIA symptoms or focal motor deficits. There is no prior documented CVA.   The patient is taking enteric-coated aspirin 81 mg daily.   There is no history of migraine headaches. There is no history of seizures.   No recent shortening of the patient's walking distance or new symptoms consistent with claudication.  No history of rest pain symptoms. No new ulcers or wounds of the lower extremities have occurred.   There is no history of DVT, PE or superficial thrombophlebitis. No recent episodes of angina or shortness of breath documented.    Carotid Duplex done today shows 40-59% bilateral ICA stenosis.  No change compared to last study in 03/06/2022  No outpatient medications have been marked as taking for the 06/02/23 encounter (Appointment) with Gilda Crease, Latina Craver, MD.    Past Medical History:  Diagnosis Date   Arthritis    spinal column   COPD (chronic obstructive pulmonary disease) (HCC)    Coronary artery disease    Dyspnea    with exertion   Dysrhythmia    GERD (gastroesophageal reflux disease)    Gout    right foot   HOH (hard of hearing)    Hypercholesterolemia    Hyperlipemia    Hypertension    MRSA (methicillin resistant Staphylococcus aureus) 2016   HX of right lower leg   Wears dentures    full upper, partial lower    Past Surgical History:  Procedure Laterality Date   CARDIAC CATHETERIZATION  12/2019   CAROTID PTA/STENT INTERVENTION Left 06/28/2020   Procedure: CAROTID PTA/STENT INTERVENTION;  Surgeon: Renford Dills, MD;  Location: [facility];  Service: Cardiovascular;  Laterality: Left;    CATARACT EXTRACTION W/PHACO Left 02/22/2020   Procedure: CATARACT EXTRACTION PHACO AND INTRAOCULAR LENS PLACEMENT (IOC) LEFT 4.45 00:41.2;  Surgeon: Galen Manila, MD;  Location: [facility];  Service: Ophthalmology;  Laterality: Left;   CATARACT EXTRACTION W/PHACO Right 03/14/2020   Procedure: CATARACT EXTRACTION PHACO AND INTRAOCULAR LENS PLACEMENT (IOC) RIGHT 6.06 00:50.4;  Surgeon: Galen Manila, MD;  Location: [facility];  Service: Ophthalmology;  Laterality: Right;   COLONOSCOPY     pt states he has had x 2   CORONARY STENT INTERVENTION N/A 11/03/2020   Procedure: CORONARY STENT INTERVENTION;  Surgeon: Iran Ouch, MD;  Location: [facility];  Service: Cardiovascular;  Laterality: N/A;   fusion     cervical 3-7, repair ruptured disc   JOINT REPLACEMENT Bilateral    bilateral hip replacements   LEFT HEART CATH N/A 01/06/2020   Procedure: Left Heart Cath with possible intervention;  Surgeon: Laurier Nancy, MD;  Location: [facility];  Service: Cardiovascular;  Laterality: N/A;   LEFT HEART CATH AND CORONARY ANGIOGRAPHY Right 11/03/2020   Procedure: LEFT HEART CATH AND CORONARY ANGIOGRAPHY with Possible Interventon;  Surgeon: Laurier Nancy, MD;  Location: [facility];  Service: Cardiovascular;  Laterality: Right;   LEFT HEART CATH AND CORONARY ANGIOGRAPHY N/A 03/19/2022   Procedure: LEFT  HEART CATH AND CORONARY ANGIOGRAPHY;  Surgeon: Laurier Nancy, MD;  Location: [facility];  Service: Cardiovascular;  Laterality: N/A;   LEFT HEART CATH AND CORONARY ANGIOGRAPHY Left 04/24/2023   Procedure: LEFT HEART CATH AND CORONARY ANGIOGRAPHY with coronary intervention;  Surgeon: Laurier Nancy, MD;  Location: [facility];  Service: Cardiovascular;  Laterality: Left;   TOTAL HIP ARTHROPLASTY     bilateral    Social History Social History   Tobacco Use   Smoking status: Former    Current packs/day: 0.00    Types:  Cigarettes    Quit date: 10/1995    Years since quitting: 27.6   Smokeless tobacco: Never  Vaping Use   Vaping status: Never Used  Substance Use Topics   Alcohol use: Yes    Alcohol/week: 12.0 standard drinks of alcohol    Types: 12 Cans of beer per week   Drug use: Never    Family History Family History  Problem Relation Age of Onset   Hypertension Mother     Allergies  Allergen Reactions   Benazepril-Hydrochlorothiazide Swelling    facial     REVIEW OF SYSTEMS (Negative unless checked)  Constitutional: [] Weight loss  [] Fever  [] Chills Cardiac: [] Chest pain   [] Chest pressure   [] Palpitations   [] Shortness of breath when laying flat   [] Shortness of breath with exertion. Vascular:  [x] Pain in legs with walking   [] Pain in legs at rest  [] History of DVT   [] Phlebitis   [] Swelling in legs   [] Varicose veins   [] Non-healing ulcers Pulmonary:   [] Uses home oxygen   [] Productive cough   [] Hemoptysis   [] Wheeze  [] COPD   [] Asthma Neurologic:  [] Dizziness   [] Seizures   [] History of stroke   [] History of TIA  [] Aphasia   [] Vissual changes   [] Weakness or numbness in arm   [] Weakness or numbness in leg Musculoskeletal:   [] Joint swelling   [] Joint pain   [] Low back pain Hematologic:  [] Easy bruising  [] Easy bleeding   [] Hypercoagulable state   [] Anemic Gastrointestinal:  [] Diarrhea   [] Vomiting  [x] Gastroesophageal reflux/heartburn   [] Difficulty swallowing. Genitourinary:  [] Chronic kidney disease   [] Difficult urination  [] Frequent urination   [] Blood in urine Skin:  [] Rashes   [] Ulcers  Psychological:  [] History of anxiety   []  History of major depression.  Physical Examination  There were no vitals filed for this visit. There is no height or weight on file to calculate BMI. Gen: WD/WN, NAD Head: [place]/AT, No temporalis wasting.  Ear/Nose/Throat: Hearing grossly intact, nares w/o erythema or drainage Eyes: PER, EOMI, sclera nonicteric.  Neck: Supple, no masses.  No bruit or  JVD.  Pulmonary:  Good air movement, no audible wheezing, no use of accessory muscles.  Cardiac: RRR, normal S1, S2, no Murmurs. Vascular:  carotid bruit noted Vessel Right Left  Radial Palpable Palpable  Carotid  Palpable  Palpable  Subclav  Palpable Palpable  Gastrointestinal: soft, non-distended. No guarding/no peritoneal signs.  Musculoskeletal: M/S 5/5 throughout.  No visible deformity.  Neurologic: CN 2-12 intact. Pain and light touch intact in extremities.  Symmetrical.  Speech is fluent. Motor exam as listed above. Psychiatric: Judgment intact, Mood & affect appropriate for pt's clinical situation. Dermatologic: No rashes or ulcers noted.  No changes consistent with cellulitis.   CBC Lab Results  Component Value Date   WBC 8.0 04/18/2023   HGB 15.0 04/18/2023   HCT 44.5 04/18/2023   MCV 96 04/18/2023  PLT 237 04/18/2023    BMET    Component Value Date/Time   NA 141 04/18/2023 1124   K 3.9 04/18/2023 1124   CL 99 04/18/2023 1124   CO2 23 04/18/2023 1124   GLUCOSE 140 (H) 04/18/2023 1124   GLUCOSE 144 (H) 03/18/2022 0534   BUN 14 04/18/2023 1124   CREATININE 1.04 04/18/2023 1124   CALCIUM 9.1 04/18/2023 1124   GFRNONAA >60 03/18/2022 0534   GFRAA >60 12/06/2019 1848   CrCl cannot be calculated (Patient's most recent lab result is older than the maximum 21 days allowed.).  COAG No results found for: "INR", "PROTIME"  Radiology No results found.   Assessment/Plan There are no diagnoses linked to this encounter.   Levora Dredge, MD  06/01/2023 1:28 PM

## 2023-06-02 ENCOUNTER — Encounter (INDEPENDENT_AMBULATORY_CARE_PROVIDER_SITE_OTHER): Payer: Self-pay | Admitting: Vascular Surgery

## 2023-06-02 ENCOUNTER — Ambulatory Visit (INDEPENDENT_AMBULATORY_CARE_PROVIDER_SITE_OTHER): Payer: Medicare PPO

## 2023-06-02 ENCOUNTER — Ambulatory Visit (INDEPENDENT_AMBULATORY_CARE_PROVIDER_SITE_OTHER): Payer: Medicare Other | Admitting: Vascular Surgery

## 2023-06-02 ENCOUNTER — Telehealth: Payer: Self-pay

## 2023-06-02 VITALS — BP 150/83 | HR 74 | Resp 18 | Ht 71.0 in | Wt 179.8 lb

## 2023-06-02 DIAGNOSIS — I6523 Occlusion and stenosis of bilateral carotid arteries: Secondary | ICD-10-CM

## 2023-06-02 DIAGNOSIS — I10 Essential (primary) hypertension: Secondary | ICD-10-CM | POA: Diagnosis not present

## 2023-06-02 DIAGNOSIS — E782 Mixed hyperlipidemia: Secondary | ICD-10-CM

## 2023-06-02 DIAGNOSIS — I739 Peripheral vascular disease, unspecified: Secondary | ICD-10-CM

## 2023-06-02 DIAGNOSIS — I25118 Atherosclerotic heart disease of native coronary artery with other forms of angina pectoris: Secondary | ICD-10-CM | POA: Diagnosis not present

## 2023-06-02 NOTE — Telephone Encounter (Signed)
 A user error has taken place: orders placed in error, not carried out on this patient.

## 2023-06-11 ENCOUNTER — Other Ambulatory Visit: Payer: Self-pay | Admitting: Cardiovascular Disease

## 2023-06-11 DIAGNOSIS — I25118 Atherosclerotic heart disease of native coronary artery with other forms of angina pectoris: Secondary | ICD-10-CM

## 2023-06-11 DIAGNOSIS — I2089 Other forms of angina pectoris: Secondary | ICD-10-CM

## 2023-06-11 DIAGNOSIS — T82855A Stenosis of coronary artery stent, initial encounter: Secondary | ICD-10-CM

## 2023-06-11 DIAGNOSIS — I6523 Occlusion and stenosis of bilateral carotid arteries: Secondary | ICD-10-CM

## 2023-06-11 DIAGNOSIS — E782 Mixed hyperlipidemia: Secondary | ICD-10-CM

## 2023-06-11 DIAGNOSIS — I10 Essential (primary) hypertension: Secondary | ICD-10-CM

## 2023-06-24 ENCOUNTER — Ambulatory Visit: Payer: Medicare Other | Admitting: Cardiovascular Disease

## 2023-06-24 ENCOUNTER — Encounter: Payer: Self-pay | Admitting: Cardiovascular Disease

## 2023-06-24 VITALS — BP 131/81 | HR 75 | Ht 71.0 in | Wt 177.6 lb

## 2023-06-24 DIAGNOSIS — I25118 Atherosclerotic heart disease of native coronary artery with other forms of angina pectoris: Secondary | ICD-10-CM

## 2023-06-24 DIAGNOSIS — Z01810 Encounter for preprocedural cardiovascular examination: Secondary | ICD-10-CM

## 2023-06-24 DIAGNOSIS — T82855D Stenosis of coronary artery stent, subsequent encounter: Secondary | ICD-10-CM

## 2023-06-24 DIAGNOSIS — I6523 Occlusion and stenosis of bilateral carotid arteries: Secondary | ICD-10-CM

## 2023-06-24 DIAGNOSIS — R0789 Other chest pain: Secondary | ICD-10-CM | POA: Diagnosis not present

## 2023-06-24 DIAGNOSIS — I10 Essential (primary) hypertension: Secondary | ICD-10-CM | POA: Diagnosis not present

## 2023-06-24 NOTE — Progress Notes (Signed)
 Cardiology Office Note   Date:  06/24/2023   ID:  Scott Parker, DOB February 11, 1946, MRN [id number]  PCP:  System, Provider Not In  Cardiologist:  Adrian Blackwater, MD      History of Present Illness: Scott Parker is a 78 y.o. male who presents for  Chief Complaint  Patient presents with   Follow-up    2 month follow up   -pt is not currently takin gplavix due to an upcoming surgery scheduled    Having chest pain, satirday after eating spicy food.      Past Medical History:  Diagnosis Date   Arthritis    spinal column   COPD (chronic obstructive pulmonary disease) (HCC)    Coronary artery disease    Dyspnea    with exertion   Dysrhythmia    GERD (gastroesophageal reflux disease)    Gout    right foot   HOH (hard of hearing)    Hypercholesterolemia    Hyperlipemia    Hypertension    MRSA (methicillin resistant Staphylococcus aureus) 2016   HX of right lower leg   Wears dentures    full upper, partial lower     Past Surgical History:  Procedure Laterality Date   CARDIAC CATHETERIZATION  12/2019   CAROTID PTA/STENT INTERVENTION Left 06/28/2020   Procedure: CAROTID PTA/STENT INTERVENTION;  Surgeon: Renford Dills, MD;  Location: [facility];  Service: Cardiovascular;  Laterality: Left;   CATARACT EXTRACTION W/PHACO Left 02/22/2020   Procedure: CATARACT EXTRACTION PHACO AND INTRAOCULAR LENS PLACEMENT (IOC) LEFT 4.45 00:41.2;  Surgeon: Galen Manila, MD;  Location: [facility];  Service: Ophthalmology;  Laterality: Left;   CATARACT EXTRACTION W/PHACO Right 03/14/2020   Procedure: CATARACT EXTRACTION PHACO AND INTRAOCULAR LENS PLACEMENT (IOC) RIGHT 6.06 00:50.4;  Surgeon: Galen Manila, MD;  Location: [facility];  Service: Ophthalmology;  Laterality: Right;   COLONOSCOPY     pt states he has had x 2   CORONARY STENT INTERVENTION N/A 11/03/2020   Procedure: CORONARY STENT INTERVENTION;  Surgeon: Iran Ouch, MD;   Location: [facility];  Service: Cardiovascular;  Laterality: N/A;   fusion     cervical 3-7, repair ruptured disc   JOINT REPLACEMENT Bilateral    bilateral hip replacements   LEFT HEART CATH N/A 01/06/2020   Procedure: Left Heart Cath with possible intervention;  Surgeon: Laurier Nancy, MD;  Location: [facility];  Service: Cardiovascular;  Laterality: N/A;   LEFT HEART CATH AND CORONARY ANGIOGRAPHY Right 11/03/2020   Procedure: LEFT HEART CATH AND CORONARY ANGIOGRAPHY with Possible Interventon;  Surgeon: Laurier Nancy, MD;  Location: [facility];  Service: Cardiovascular;  Laterality: Right;   LEFT HEART CATH AND CORONARY ANGIOGRAPHY N/A 03/19/2022   Procedure: LEFT HEART CATH AND CORONARY ANGIOGRAPHY;  Surgeon: Laurier Nancy, MD;  Location: [facility];  Service: Cardiovascular;  Laterality: N/A;   LEFT HEART CATH AND CORONARY ANGIOGRAPHY Left 04/24/2023   Procedure: LEFT HEART CATH AND CORONARY ANGIOGRAPHY with coronary intervention;  Surgeon: Laurier Nancy, MD;  Location: [facility];  Service: Cardiovascular;  Laterality: Left;   TOTAL HIP ARTHROPLASTY     bilateral     Current Outpatient Medications  Medication Sig Dispense Refill   aspirin EC 81 MG tablet Take 81 mg by mouth daily. Swallow whole.     atorvastatin (LIPITOR) 80 MG tablet Take 1 tablet (80 mg total) by mouth every evening.  90 tablet 0   budesonide-formoterol (SYMBICORT) 160-4.5 MCG/ACT inhaler Inhale 1 puff into the lungs daily.     Calcium Carb-Cholecalciferol (CALCIUM + D3 PO) Take 1 tablet by mouth daily.     calcium carbonate (TUMS - DOSED IN MG ELEMENTAL CALCIUM) 500 MG chewable tablet Chew 1,500-2,000 mg by mouth daily as needed for indigestion or heartburn.     CINNAMON PO Take 1,000 mg by mouth daily.     clopidogrel (PLAVIX) 75 MG tablet TAKE ONE TABLET BY MOUTH ONCE DAILY 90 tablet 0   ezetimibe (ZETIA) 10 MG tablet Take 10 mg by mouth daily.     GARLIC PO  Take 1,000 mg by mouth daily.     hydrochlorothiazide (HYDRODIURIL) 25 MG tablet Take 25 mg by mouth daily.     isosorbide mononitrate (IMDUR) 60 MG 24 hr tablet TAKE ONE TABLET BY MOUTH TWICE A DAY. 180 tablet 0   metoprolol succinate (TOPROL XL) 50 MG 24 hr tablet Take 1 tablet (50 mg total) by mouth daily. Take with or immediately following a meal. 30 tablet 11   nitroGLYCERIN (NITROSTAT) 0.4 MG SL tablet Place 1 tablet (0.4 mg total) under the tongue every 5 (five) minutes as needed for chest pain. 100 tablet 3   Omega-3 Fatty Acids (FISH OIL) 1200 MG CAPS Take 1,200 mg by mouth daily.     pantoprazole (PROTONIX) 40 MG tablet Take 1 tablet (40 mg total) by mouth daily. (Patient not taking: Reported on 06/02/2023) 30 tablet 11   Polyvinyl Alcohol-Povidone (REFRESH OP) Place 1 drop into both eyes 3 (three) times daily.     ranolazine (RANEXA) 1000 MG SR tablet TAKE ONE TABLET BY MOUTH TWICE DAILY 60 tablet 0   White Petrolatum-Mineral Oil (SYSTANE NIGHTTIME) OINT Place 1 Application into both eyes at bedtime.     zinc gluconate 50 MG tablet Take 50 mg by mouth 2 (two) times daily.     No current facility-administered medications for this visit.    Allergies:   Benazepril-hydrochlorothiazide    Social History:   reports that he quit smoking about 27 years ago. His smoking use included cigarettes. He has never used smokeless tobacco. He reports current alcohol use of about 12.0 standard drinks of alcohol per week. He reports that he does not use drugs.   Family History:  family history includes Hypertension in his mother.    ROS:     Review of Systems  Constitutional: Negative.   HENT: Negative.    Eyes: Negative.   Respiratory: Negative.    Gastrointestinal: Negative.   Genitourinary: Negative.   Musculoskeletal: Negative.   Skin: Negative.   Neurological: Negative.   Endo/Heme/Allergies: Negative.   Psychiatric/Behavioral: Negative.    All other systems reviewed and are  negative.     All other systems are reviewed and negative.    PHYSICAL EXAM: VS:  BP 131/81   Pulse 75   Ht 5\' 11"  (1.803 m)   Wt 177 lb 9.6 oz (80.6 kg)   SpO2 99%   BMI 24.77 kg/m  , BMI Body mass index is 24.77 kg/m. Last weight:  Wt Readings from Last 3 Encounters:  06/24/23 177 lb 9.6 oz (80.6 kg)  06/02/23 179 lb 12.8 oz (81.6 kg)  04/28/23 182 lb 3.2 oz (82.6 kg)     Physical Exam Vitals reviewed.  Constitutional:      Appearance: Normal appearance. He is normal weight.  HENT:     Head: Normocephalic.  Nose: Nose normal.     Mouth/Throat:     Mouth: Mucous membranes are moist.  Eyes:     Pupils: Pupils are equal, round, and reactive to light.  Cardiovascular:     Rate and Rhythm: Normal rate and regular rhythm.     Pulses: Normal pulses.     Heart sounds: Normal heart sounds.  Pulmonary:     Effort: Pulmonary effort is normal.  Abdominal:     General: Abdomen is flat. Bowel sounds are normal.  Musculoskeletal:        General: Normal range of motion.     Cervical back: Normal range of motion.  Skin:    General: Skin is warm.  Neurological:     General: No focal deficit present.     Mental Status: He is alert.  Psychiatric:        Mood and Affect: Mood normal.       EKG:   Recent Labs: 04/18/2023: ALT 27; BUN 14; Creatinine, Ser 1.04; Hemoglobin 15.0; Platelets 237; Potassium 3.9; Sodium 141    Lipid Panel No results found for: "CHOL", "TRIG", "HDL", "CHOLHDL", "VLDL", "LDLCALC", "LDLDIRECT"    Other studies Reviewed: Additional studies/ records that were reviewed today include:  Review of the above records demonstrates:       No data to display            ASSESSMENT AND PLAN:    ICD-10-CM   1. Preoperative cardiovascular examination  Z01.810    Getting eye surgery and plavix on hold. Advise starting plavix friday    2. Essential hypertension  I10     3. Coronary artery disease of native artery of native heart with stable  angina pectoris (HCC)  I25.118     4. 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 Coronary stent restenosis, subsequent encounter  T82.855D    Cath 1/25 no siginificant disease.    6. 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 Advise starting plavix friday     Other chest pain              Disposition:   Return in about 3 months (around 09/24/2023).    Total time spent: 35 minutes  Signed,  Adrian Blackwater, MD  06/24/2023 11:04 AM    [facility]

## 2023-07-11 ENCOUNTER — Other Ambulatory Visit: Payer: Self-pay | Admitting: Cardiovascular Disease

## 2023-07-11 DIAGNOSIS — I2089 Other forms of angina pectoris: Secondary | ICD-10-CM

## 2023-07-11 DIAGNOSIS — T82855A Stenosis of coronary artery stent, initial encounter: Secondary | ICD-10-CM

## 2023-07-11 DIAGNOSIS — I25118 Atherosclerotic heart disease of native coronary artery with other forms of angina pectoris: Secondary | ICD-10-CM

## 2023-07-11 DIAGNOSIS — I10 Essential (primary) hypertension: Secondary | ICD-10-CM

## 2023-07-11 DIAGNOSIS — E782 Mixed hyperlipidemia: Secondary | ICD-10-CM

## 2023-07-11 DIAGNOSIS — I6523 Occlusion and stenosis of bilateral carotid arteries: Secondary | ICD-10-CM

## 2023-07-25 ENCOUNTER — Ambulatory Visit (INDEPENDENT_AMBULATORY_CARE_PROVIDER_SITE_OTHER): Admitting: Cardiovascular Disease

## 2023-07-25 ENCOUNTER — Encounter: Payer: Self-pay | Admitting: Cardiovascular Disease

## 2023-07-25 VITALS — BP 130/80 | HR 97 | Ht 71.0 in | Wt 171.6 lb

## 2023-07-25 DIAGNOSIS — I25118 Atherosclerotic heart disease of native coronary artery with other forms of angina pectoris: Secondary | ICD-10-CM | POA: Diagnosis not present

## 2023-07-25 DIAGNOSIS — R0602 Shortness of breath: Secondary | ICD-10-CM

## 2023-07-25 DIAGNOSIS — I259 Chronic ischemic heart disease, unspecified: Secondary | ICD-10-CM | POA: Diagnosis not present

## 2023-07-25 DIAGNOSIS — E782 Mixed hyperlipidemia: Secondary | ICD-10-CM | POA: Diagnosis not present

## 2023-07-25 DIAGNOSIS — J189 Pneumonia, unspecified organism: Secondary | ICD-10-CM

## 2023-07-25 DIAGNOSIS — I6523 Occlusion and stenosis of bilateral carotid arteries: Secondary | ICD-10-CM

## 2023-07-25 NOTE — Progress Notes (Signed)
 Cardiology Office Note   Date:  07/25/2023   ID:  Scott Parker, DOB [birth year], MRN [id number]  PCP:  System, Provider Not In  Cardiologist:  Debborah Fairly, MD      History of Present Illness: Scott Parker is a 78 y.o. male who presents for  Chief Complaint  Patient presents with   Follow-up    Hospital follow up    Was in hospital with pneumonia.      Past Medical History:  Diagnosis Date   Arthritis    spinal column   COPD (chronic obstructive pulmonary disease) (HCC)    Coronary artery disease    Dyspnea    with exertion   Dysrhythmia    GERD (gastroesophageal reflux disease)    Gout    right foot   HOH (hard of hearing)    Hypercholesterolemia    Hyperlipemia    Hypertension    MRSA (methicillin resistant Staphylococcus aureus) 2016   HX of right lower leg   Wears dentures    full upper, partial lower     Past Surgical History:  Procedure Laterality Date   CARDIAC CATHETERIZATION  12/2019   CAROTID PTA/STENT INTERVENTION Left 06/28/2020   Procedure: CAROTID PTA/STENT INTERVENTION;  Surgeon: Jackquelyn Mass, MD;  Location: [facility];  Service: Cardiovascular;  Laterality: Left;   CATARACT EXTRACTION W/PHACO Left 02/22/2020   Procedure: CATARACT EXTRACTION PHACO AND INTRAOCULAR LENS PLACEMENT (IOC) LEFT 4.45 00:41.2;  Surgeon: Clair Crews, MD;  Location: [facility];  Service: Ophthalmology;  Laterality: Left;   CATARACT EXTRACTION W/PHACO Right 03/14/2020   Procedure: CATARACT EXTRACTION PHACO AND INTRAOCULAR LENS PLACEMENT (IOC) RIGHT 6.06 00:50.4;  Surgeon: Clair Crews, MD;  Location: [facility];  Service: Ophthalmology;  Laterality: Right;   COLONOSCOPY     pt states he has had x 2   CORONARY STENT INTERVENTION N/A 11/03/2020   Procedure: CORONARY STENT INTERVENTION;  Surgeon: Wenona Hamilton, MD;  Location: [facility];  Service: Cardiovascular;  Laterality: N/A;   fusion     cervical 3-7,  repair ruptured disc   JOINT REPLACEMENT Bilateral    bilateral hip replacements   LEFT HEART CATH N/A 01/06/2020   Procedure: Left Heart Cath with possible intervention;  Surgeon: Cherrie Cornwall, MD;  Location: [facility];  Service: Cardiovascular;  Laterality: N/A;   LEFT HEART CATH AND CORONARY ANGIOGRAPHY Right 11/03/2020   Procedure: LEFT HEART CATH AND CORONARY ANGIOGRAPHY with Possible Interventon;  Surgeon: Cherrie Cornwall, MD;  Location: [facility];  Service: Cardiovascular;  Laterality: Right;   LEFT HEART CATH AND CORONARY ANGIOGRAPHY N/A 03/19/2022   Procedure: LEFT HEART CATH AND CORONARY ANGIOGRAPHY;  Surgeon: Cherrie Cornwall, MD;  Location: [facility];  Service: Cardiovascular;  Laterality: N/A;   LEFT HEART CATH AND CORONARY ANGIOGRAPHY Left 04/24/2023   Procedure: LEFT HEART CATH AND CORONARY ANGIOGRAPHY with coronary intervention;  Surgeon: Cherrie Cornwall, MD;  Location: [facility];  Service: Cardiovascular;  Laterality: Left;   TOTAL HIP ARTHROPLASTY     bilateral     Current Outpatient Medications  Medication Sig Dispense Refill   aspirin  EC 81 MG tablet Take 81 mg by mouth daily. Swallow whole.     atorvastatin  (LIPITOR ) 80 MG tablet Take 1 tablet (80 mg total) by mouth every evening. 90 tablet 0   budesonide-formoterol (SYMBICORT) 160-4.5 MCG/ACT inhaler Inhale 1 puff into the lungs daily.  Calcium  Carb-Cholecalciferol (CALCIUM  + D3 PO) Take 1 tablet by mouth daily.     CINNAMON PO Take 1,000 mg by mouth daily.     clopidogrel  (PLAVIX ) 75 MG tablet TAKE ONE TABLET BY MOUTH ONCE DAILY 90 tablet 0   ezetimibe (ZETIA) 10 MG tablet Take 10 mg by mouth daily.     GARLIC PO Take 1,000 mg by mouth daily.     hydrochlorothiazide  (HYDRODIURIL ) 25 MG tablet Take 25 mg by mouth daily.     isosorbide  mononitrate (IMDUR ) 60 MG 24 hr tablet TAKE ONE TABLET BY MOUTH TWICE A DAY. 180 tablet 0   metoprolol  succinate (TOPROL  XL) 50 MG 24 hr  tablet Take 1 tablet (50 mg total) by mouth daily. Take with or immediately following a meal. 30 tablet 11   nitroGLYCERIN  (NITROSTAT ) 0.4 MG SL tablet Place 1 tablet (0.4 mg total) under the tongue every 5 (five) minutes as needed for chest pain. 100 tablet 3   Omega-3 Fatty Acids (FISH OIL) 1200 MG CAPS Take 1,200 mg by mouth daily.     pantoprazole  (PROTONIX ) 40 MG tablet Take 1 tablet (40 mg total) by mouth daily. 30 tablet 11   Polyvinyl Alcohol-Povidone (REFRESH OP) Place 1 drop into both eyes 3 (three) times daily.     ranolazine  (RANEXA ) 1000 MG SR tablet TAKE ONE TABLET BY MOUTH TWICE DAILY 60 tablet 0   White Petrolatum-Mineral Oil (SYSTANE NIGHTTIME) OINT Place 1 Application into both eyes at bedtime.     zinc gluconate 50 MG tablet Take 50 mg by mouth 2 (two) times daily.     calcium  carbonate (TUMS - DOSED IN MG ELEMENTAL CALCIUM ) 500 MG chewable tablet Chew 1,500-2,000 mg by mouth daily as needed for indigestion or heartburn.     No current facility-administered medications for this visit.    Allergies:   Benazepril-hydrochlorothiazide     Social History:   reports that he quit smoking about 27 years ago. His smoking use included cigarettes. He has never used smokeless tobacco. He reports current alcohol use of about 12.0 standard drinks of alcohol per week. He reports that he does not use drugs.   Family History:  family history includes Hypertension in his mother.    ROS:     Review of Systems  Constitutional: Negative.   HENT: Negative.    Eyes: Negative.   Respiratory: Negative.    Gastrointestinal: Negative.   Genitourinary: Negative.   Musculoskeletal: Negative.   Skin: Negative.   Neurological: Negative.   Endo/Heme/Allergies: Negative.   Psychiatric/Behavioral: Negative.    All other systems reviewed and are negative.     All other systems are reviewed and negative.    PHYSICAL EXAM: VS:  BP 130/80   Pulse 97   Ht 5\' 11"  (1.803 m)   Wt 171 lb 9.6 oz  (77.8 kg)   SpO2 95%   BMI 23.93 kg/m  , BMI Body mass index is 23.93 kg/m. Last weight:  Wt Readings from Last 3 Encounters:  07/25/23 171 lb 9.6 oz (77.8 kg)  06/24/23 177 lb 9.6 oz (80.6 kg)  06/02/23 179 lb 12.8 oz (81.6 kg)     Physical Exam Vitals reviewed.  Constitutional:      Appearance: Normal appearance. He is normal weight.  HENT:     Head: Normocephalic.     Nose: Nose normal.     Mouth/Throat:     Mouth: Mucous membranes are moist.  Eyes:     Pupils: Pupils are equal,  round, and reactive to light.  Cardiovascular:     Rate and Rhythm: Normal rate and regular rhythm.     Pulses: Normal pulses.     Heart sounds: Normal heart sounds.  Pulmonary:     Effort: Pulmonary effort is normal.  Abdominal:     General: Abdomen is flat. Bowel sounds are normal.  Musculoskeletal:        General: Normal range of motion.     Cervical back: Normal range of motion.  Skin:    General: Skin is warm.  Neurological:     General: No focal deficit present.     Mental Status: He is alert.  Psychiatric:        Mood and Affect: Mood normal.       EKG:   Recent Labs: 04/18/2023: ALT 27; BUN 14; Creatinine, Ser 1.04; Hemoglobin 15.0; Platelets 237; Potassium 3.9; Sodium 141    Lipid Panel No results found for: "CHOL", "TRIG", "HDL", "CHOLHDL", "VLDL", "LDLCALC", "LDLDIRECT"    Other studies Reviewed: Additional studies/ records that were reviewed today include:  Review of the above records demonstrates:       No data to display            ASSESSMENT AND PLAN:    ICD-10-CM   1. Chest pain due to myocardial ischemia, unspecified ischemic chest pain type  I25.9 Ambulatory referral to Pulmonology   occasional pressure, but stable with medications GDMT    2. Coronary artery disease of native artery of native heart with stable angina pectoris (HCC)  I25.118 Ambulatory referral to Pulmonology    3. Mixed hyperlipidemia  E78.2 Ambulatory referral to Pulmonology     4. SOB (shortness of breath)  R06.02 Ambulatory referral to Pulmonology    5. Bilateral carotid artery stenosis  I65.23 Ambulatory referral to Pulmonology    6. Pneumonia of both lungs due to infectious organism, unspecified part of lung  J18.9 Ambulatory referral to Pulmonology   Has repeated pneumonia, need to see pulmonary saadat Toy Eisemann       Problem List Items Addressed This Visit       Cardiovascular and Mediastinum   CAD (coronary artery disease)   Relevant Orders   Ambulatory referral to Pulmonology   Carotid stenosis   Relevant Orders   Ambulatory referral to Pulmonology     Other   Hyperlipidemia   Relevant Orders   Ambulatory referral to Pulmonology   Chest pain due to myocardial ischemia - Primary   Relevant Orders   Ambulatory referral to Pulmonology   Other Visit Diagnoses       SOB (shortness of breath)       Relevant Orders   Ambulatory referral to Pulmonology     Pneumonia of both lungs due to infectious organism, unspecified part of lung       Has repeated pneumonia, need to see pulmonary saadat Early Steel   Relevant Orders   Ambulatory referral to Pulmonology          Disposition:   Return in about 2 months (around 09/24/2023) for f/u me 2 month, set up to see [facility] for recurrent pneumonia.    Total time spent: 35 minutes  Signed,  Debborah Fairly, MD  07/25/2023 11:50 AM    [facility]

## 2023-07-31 ENCOUNTER — Other Ambulatory Visit: Payer: Self-pay | Admitting: Cardiovascular Disease

## 2023-07-31 DIAGNOSIS — I10 Essential (primary) hypertension: Secondary | ICD-10-CM

## 2023-07-31 DIAGNOSIS — I25118 Atherosclerotic heart disease of native coronary artery with other forms of angina pectoris: Secondary | ICD-10-CM

## 2023-08-11 ENCOUNTER — Other Ambulatory Visit: Payer: Self-pay | Admitting: Cardiovascular Disease

## 2023-08-11 DIAGNOSIS — I10 Essential (primary) hypertension: Secondary | ICD-10-CM

## 2023-08-11 DIAGNOSIS — I2089 Other forms of angina pectoris: Secondary | ICD-10-CM

## 2023-08-11 DIAGNOSIS — I25118 Atherosclerotic heart disease of native coronary artery with other forms of angina pectoris: Secondary | ICD-10-CM

## 2023-08-11 DIAGNOSIS — T82855A Stenosis of coronary artery stent, initial encounter: Secondary | ICD-10-CM

## 2023-08-11 DIAGNOSIS — I6523 Occlusion and stenosis of bilateral carotid arteries: Secondary | ICD-10-CM

## 2023-08-11 DIAGNOSIS — E782 Mixed hyperlipidemia: Secondary | ICD-10-CM

## 2023-08-12 ENCOUNTER — Ambulatory Visit: Payer: Self-pay | Admitting: Internal Medicine

## 2023-08-12 VITALS — BP 103/67 | HR 89 | Temp 97.8°F | Resp 16 | Ht 71.0 in | Wt 175.0 lb

## 2023-08-12 DIAGNOSIS — J4489 Other specified chronic obstructive pulmonary disease: Secondary | ICD-10-CM | POA: Diagnosis not present

## 2023-08-12 DIAGNOSIS — I2510 Atherosclerotic heart disease of native coronary artery without angina pectoris: Secondary | ICD-10-CM | POA: Diagnosis not present

## 2023-08-12 DIAGNOSIS — I2583 Coronary atherosclerosis due to lipid rich plaque: Secondary | ICD-10-CM

## 2023-08-12 DIAGNOSIS — R0602 Shortness of breath: Secondary | ICD-10-CM

## 2023-08-12 DIAGNOSIS — R911 Solitary pulmonary nodule: Secondary | ICD-10-CM

## 2023-08-12 NOTE — Patient Instructions (Signed)

## 2023-08-12 NOTE — Progress Notes (Signed)
 [facility] [address] [place], [place] [address]  Pulmonary Sleep Medicine   Office Visit Note  Patient Name: Scott Parker DOB: Oct 16, 1945 MRN [id number]  Date of Service: 08/12/2023  Complaints/HPI: Patient states he had pneumonia about 2 years ago. Patient was treated for the infection and states he has been feeling better. He does have history of CAD and has had a stent done in the past. Patient states he has not had any other cardiac issues noted. He states he used to smoke and this was 28 years ago. He used to smoke 2PPD. He states he does have a history of COPD in the past. He states he was treated with inhalers for this occasional symbicort and he states he can be without it for weeks on end. He does have some shortness of breath some sputum clear usually and no cough noted.   CT scan as below  IMPRESSION: 1. Left upper lower lobe patchy peribronchovascular airspace opacities, predominantly ground-glass, consistent with multifocal pneumonia. 2. Small area of linear and discoid opacity in the right upper lobe adjacent to the minor fissure is likely scarring/atelectasis. 3. 7 mm nodule, left lower lobe. Non-contrast chest CT at 6-12 months is recommended. If the nodule is stable at time of repeat CT, then future CT at 18-24 months (from today's scan) is considered optional for low-risk patients, but is recommended for high-risk patients. This recommendation follows the consensus statement: Guidelines for Management of Incidental Pulmonary Nodules Detected on CT Images: From the Fleischner Society 2017; Radiology 2017; 284:228-243. 4. Coronary artery calcifications and aortic atherosclerosis. Minor apical paraseptal emphysema.   Aortic Atherosclerosis (ICD10-I70.0) and Emphysema (ICD10-J43.9).   Office Spirometry Results:     ROS  General: (-) fever, (-) chills, (-) night sweats, (-) weakness Skin: (-) rashes, (-) itching,. Eyes: (-) visual changes,  (-) redness, (-) itching. Nose and Sinuses: (-) nasal stuffiness or itchiness, (-) postnasal drip, (-) nosebleeds, (-) sinus trouble. Mouth and Throat: (-) sore throat, (-) hoarseness. Neck: (-) swollen glands, (-) enlarged thyroid, (-) neck pain. Respiratory: - cough, (-) bloody sputum, + shortness of breath, - wheezing. Cardiovascular: - ankle swelling, (-) chest pain. Lymphatic: (-) lymph node enlargement. Neurologic: (-) numbness, (-) tingling. Psychiatric: (-) anxiety, (-) depression   Current Medication: Outpatient Encounter Medications as of 08/12/2023  Medication Sig   aspirin  EC 81 MG tablet Take 81 mg by mouth daily. Swallow whole.   atorvastatin  (LIPITOR ) 80 MG tablet Take 1 tablet (80 mg total) by mouth every evening.   budesonide-formoterol (SYMBICORT) 160-4.5 MCG/ACT inhaler Inhale 1 puff into the lungs daily.   Calcium  Carb-Cholecalciferol (CALCIUM  + D3 PO) Take 1 tablet by mouth daily.   calcium  carbonate (TUMS - DOSED IN MG ELEMENTAL CALCIUM ) 500 MG chewable tablet Chew 1,500-2,000 mg by mouth daily as needed for indigestion or heartburn.   CINNAMON PO Take 1,000 mg by mouth daily.   clopidogrel  (PLAVIX ) 75 MG tablet TAKE ONE TABLET BY MOUTH ONCE DAILY   ezetimibe (ZETIA) 10 MG tablet Take 10 mg by mouth daily.   GARLIC PO Take 1,000 mg by mouth daily.   hydrochlorothiazide  (HYDRODIURIL ) 25 MG tablet Take 25 mg by mouth daily.   isosorbide  mononitrate (IMDUR ) 60 MG 24 hr tablet TAKE ONE TABLET BY MOUTH TWICE A DAY.   metoprolol  succinate (TOPROL -XL) 50 MG 24 hr tablet TAKE ONE TABLET BY MOUTH ONCE DAILY. TAKE WITH OR IMMEDIATELY FOLLOWING A MEAL.   nitroGLYCERIN  (NITROSTAT ) 0.4 MG SL tablet Place 1  tablet (0.4 mg total) under the tongue every 5 (five) minutes as needed for chest pain.   Omega-3 Fatty Acids (FISH OIL) 1200 MG CAPS Take 1,200 mg by mouth daily.   pantoprazole  (PROTONIX ) 40 MG tablet Take 1 tablet (40 mg total) by mouth daily.   Polyvinyl Alcohol-Povidone  (REFRESH OP) Place 1 drop into both eyes 3 (three) times daily.   ranolazine  (RANEXA ) 1000 MG SR tablet TAKE ONE TABLET BY MOUTH TWICE DAILY   White Petrolatum-Mineral Oil (SYSTANE NIGHTTIME) OINT Place 1 Application into both eyes at bedtime.   zinc gluconate 50 MG tablet Take 50 mg by mouth 2 (two) times daily.   No facility-administered encounter medications on file as of 08/12/2023.    Surgical History: Past Surgical History:  Procedure Laterality Date   CARDIAC CATHETERIZATION  12/2019   CAROTID PTA/STENT INTERVENTION Left 06/28/2020   Procedure: CAROTID PTA/STENT INTERVENTION;  Surgeon: Jackquelyn Mass, MD;  Location: [facility];  Service: Cardiovascular;  Laterality: Left;   CATARACT EXTRACTION W/PHACO Left 02/22/2020   Procedure: CATARACT EXTRACTION PHACO AND INTRAOCULAR LENS PLACEMENT (IOC) LEFT 4.45 00:41.2;  Surgeon: Clair Crews, MD;  Location: [facility];  Service: Ophthalmology;  Laterality: Left;   CATARACT EXTRACTION W/PHACO Right 03/14/2020   Procedure: CATARACT EXTRACTION PHACO AND INTRAOCULAR LENS PLACEMENT (IOC) RIGHT 6.06 00:50.4;  Surgeon: Clair Crews, MD;  Location: [facility];  Service: Ophthalmology;  Laterality: Right;   COLONOSCOPY     pt states he has had x 2   CORONARY STENT INTERVENTION N/A 11/03/2020   Procedure: CORONARY STENT INTERVENTION;  Surgeon: Wenona Hamilton, MD;  Location: [facility];  Service: Cardiovascular;  Laterality: N/A;   fusion     cervical 3-7, repair ruptured disc   JOINT REPLACEMENT Bilateral    bilateral hip replacements   LEFT HEART CATH N/A 01/06/2020   Procedure: Left Heart Cath with possible intervention;  Surgeon: Cherrie Cornwall, MD;  Location: [facility];  Service: Cardiovascular;  Laterality: N/A;   LEFT HEART CATH AND CORONARY ANGIOGRAPHY Right 11/03/2020   Procedure: LEFT HEART CATH AND CORONARY ANGIOGRAPHY with Possible Interventon;  Surgeon: Cherrie Cornwall, MD;   Location: [facility];  Service: Cardiovascular;  Laterality: Right;   LEFT HEART CATH AND CORONARY ANGIOGRAPHY N/A 03/19/2022   Procedure: LEFT HEART CATH AND CORONARY ANGIOGRAPHY;  Surgeon: Cherrie Cornwall, MD;  Location: [facility];  Service: Cardiovascular;  Laterality: N/A;   LEFT HEART CATH AND CORONARY ANGIOGRAPHY Left 04/24/2023   Procedure: LEFT HEART CATH AND CORONARY ANGIOGRAPHY with coronary intervention;  Surgeon: Cherrie Cornwall, MD;  Location: [facility];  Service: Cardiovascular;  Laterality: Left;   TOTAL HIP ARTHROPLASTY     bilateral    Medical History: Past Medical History:  Diagnosis Date   Arthritis    spinal column   COPD (chronic obstructive pulmonary disease) (HCC)    Coronary artery disease    Dyspnea    with exertion   Dysrhythmia    GERD (gastroesophageal reflux disease)    Gout    right foot   HOH (hard of hearing)    Hypercholesterolemia    Hyperlipemia    Hypertension    MRSA (methicillin resistant Staphylococcus aureus) 2016   HX of right lower leg   Wears dentures    full upper, partial lower    Family History: Family History  Problem Relation Age of Onset   Hypertension Mother  Social History: Social History   Socioeconomic History   Marital status: Divorced    Spouse name: Not on file   Number of children: Not on file   Years of education: Not on file   Highest education level: Not on file  Occupational History   Not on file  Tobacco Use   Smoking status: Former    Current packs/day: 0.00    Types: Cigarettes    Quit date: 10/1995    Years since quitting: 27.8   Smokeless tobacco: Never  Vaping Use   Vaping status: Never Used  Substance and Sexual Activity   Alcohol use: Yes    Alcohol/week: 12.0 standard drinks of alcohol    Types: 12 Cans of beer per week   Drug use: Never   Sexual activity: Not on file  Other Topics Concern   Not on file  Social History Narrative   Lives alone. No  indoor pets.    Social Drivers of Corporate investment banker Strain: Not on file  Food Insecurity: Not on file  Transportation Needs: Not on file  Physical Activity: Not on file  Stress: Not on file  Social Connections: Unknown (05/16/2022)   Received [facility]   Social Network    Social Network: Not on file  Intimate Partner Violence: Unknown (05/16/2022)   Received from [facility]   HITS    Physically Hurt: Not on file    Insult or Talk Down To: Not on file    Threaten Physical Harm: Not on file    Scream or Curse: Not on file    Vital Signs: Blood pressure 103/67, pulse 89, temperature 97.8 F (36.6 C), resp. rate 16, height 5\' 11"  (1.803 m), weight 175 lb (79.4 kg), SpO2 96%.  Examination: General Appearance: The patient is well-developed, well-nourished, and in no distress. Skin: Gross inspection of skin unremarkable. Head: normocephalic, no gross deformities. Eyes: no gross deformities noted. ENT: ears appear grossly normal no exudates. Neck: Supple. No thyromegaly. No LAD. Respiratory: no rhonchi noted. Cardiovascular: Normal S1 and S2 without murmur or rub. Extremities: No cyanosis. pulses are equal. Neurologic: Alert and oriented. No involuntary movements.  LABS: No results found for this or any previous visit (from the past 2160 hours).  Radiology: CARDIAC CATHETERIZATION Result Date: 04/24/2023   Ost RCA to Prox RCA lesion is 30% stenosed.   1st Mrg lesion is 50% stenosed.   2nd Mrg lesion is 40% stenosed.   Prox LAD lesion is 40% stenosed.   The left ventricular systolic function is normal.   LV end diastolic pressure is mildly elevated.   The left ventricular ejection fraction is 55-65% by visual estimate.   There is no aortic valve stenosis.   Recommend uninterrupted dual antiplatelet therapy with Aspirin  81mg  daily and Clopidogrel  75mg  daily. There is no significant obstructive disease in the LAD and OM with stent in  the proximal right coronary having no significant restenosis.  Continue medical therapy.    No results found.  No results found.  Assessment and Plan: Patient Active Problem List   Diagnosis Date Noted   PAD (peripheral artery disease) (HCC) 06/02/2023   Precordial pain 04/18/2023   Chest pain due to myocardial ischemia 04/18/2023   Coronary stent restenosis 04/18/2023   Abnormal nuclear stress test 04/18/2023   Effort angina (HCC) 11/03/2020   Unstable angina (HCC) 10/26/2020   Carotid stenosis 06/30/2020   CAD (coronary artery disease) 06/04/2020   Essential hypertension  06/04/2020   Hyperlipidemia 06/04/2020   GERD (gastroesophageal reflux disease) 06/04/2020   Fracture of phalanx of thumb 06/01/2020   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 Obstructive chronic bronchitis without exacerbation (HCC) (Primary)  Patient has significant COPD spoke time about done treatment plans and options currently on Symbicort now be overall seems to be doing okay with this will continue with this.  2. Shortness of breath   Likely due to COPD will get a evaluation done as to the severely as well as look for any underlying parenchymal disease with a CT scan of the chest. - Pulmonary function test; Future - CT Chest Wo Contrast; Future  3. Coronary artery disease due to lipid rich plaque  Patient needs to follow with primary team consider Cardiology evaluation  4. Pulmonary nodule 1 cm or greater in diameter   He had nodules noted on the CT scan of the on recommended getting a repeat scan - CT Chest Wo Contrast; Future   General Counseling: I have discussed the findings of the evaluation and examination with Adaline Holly.  I have also discussed any further diagnostic evaluation thatmay be needed or ordered today. Welcome verbalizes understanding of the findings of todays visit. We also reviewed his medications today and discussed  drug interactions and side effects including but not limited excessive drowsiness and altered mental states. We also discussed that there is always a risk not just to him but also people around him. he has been encouraged to call the office with any questions or concerns that should arise related to todays visit.  No orders of the defined types were placed in this encounter.    Time spent: 8  I have personally obtained a history, examined the patient, evaluated laboratory and imaging results, formulated the assessment and plan and placed orders.    Cordie Deters, MD [facility] Pulmonary and Critical Care Sleep medicine

## 2023-08-13 ENCOUNTER — Ambulatory Visit: Admitting: Internal Medicine

## 2023-08-13 DIAGNOSIS — R0602 Shortness of breath: Secondary | ICD-10-CM | POA: Diagnosis not present

## 2023-08-14 ENCOUNTER — Ambulatory Visit
Admission: RE | Admit: 2023-08-14 | Discharge: 2023-08-14 | Disposition: A | Discharge status: Home / Self Care | Source: Ambulatory Visit | Attending: Internal Medicine | Admitting: Internal Medicine

## 2023-08-14 DIAGNOSIS — R911 Solitary pulmonary nodule: Secondary | ICD-10-CM | POA: Insufficient documentation

## 2023-08-14 DIAGNOSIS — R0602 Shortness of breath: Secondary | ICD-10-CM | POA: Diagnosis present

## 2023-09-03 NOTE — Procedures (Signed)
 [facility] [address] [place], [address]    Complete Pulmonary Function Testing Interpretation:  FINDINGS:  The forced vital capacity is normal.  FEV1 is 2.25 L which is 74% of predicted and is mildly decreased.  F1 FVC ratio is mildly decreased.  Total lung capacity is mildly decreased.  Residual volume is decreased.  FRC is decreased.  DLCO is mildly decreased.  Postbronchodilator there was no significant change in FEV1 however clinical correlation may occur in the absence of spirometric improvement.  IMPRESSION:  This pulmonary function study is consistent with mild obstructive lung disease and mild restrictive lung disease.  Clinical correlation is recommended  Cordie Deters, MD [facility] Pulmonary Critical Care Medicine Sleep Medicine

## 2023-09-09 ENCOUNTER — Other Ambulatory Visit: Payer: Self-pay | Admitting: Cardiovascular Disease

## 2023-09-09 DIAGNOSIS — I2089 Other forms of angina pectoris: Secondary | ICD-10-CM

## 2023-09-09 DIAGNOSIS — I10 Essential (primary) hypertension: Secondary | ICD-10-CM

## 2023-09-09 DIAGNOSIS — I6523 Occlusion and stenosis of bilateral carotid arteries: Secondary | ICD-10-CM

## 2023-09-09 DIAGNOSIS — T82855A Stenosis of coronary artery stent, initial encounter: Secondary | ICD-10-CM

## 2023-09-09 DIAGNOSIS — I25118 Atherosclerotic heart disease of native coronary artery with other forms of angina pectoris: Secondary | ICD-10-CM

## 2023-09-09 DIAGNOSIS — E782 Mixed hyperlipidemia: Secondary | ICD-10-CM

## 2023-09-25 ENCOUNTER — Encounter: Payer: Self-pay | Admitting: Cardiovascular Disease

## 2023-09-25 ENCOUNTER — Ambulatory Visit: Admitting: Cardiovascular Disease

## 2023-09-25 VITALS — BP 112/70 | HR 74 | Ht 71.0 in | Wt 177.0 lb

## 2023-09-25 DIAGNOSIS — E782 Mixed hyperlipidemia: Secondary | ICD-10-CM

## 2023-09-25 DIAGNOSIS — I25118 Atherosclerotic heart disease of native coronary artery with other forms of angina pectoris: Secondary | ICD-10-CM | POA: Diagnosis not present

## 2023-09-25 DIAGNOSIS — I739 Peripheral vascular disease, unspecified: Secondary | ICD-10-CM

## 2023-09-25 DIAGNOSIS — I2089 Other forms of angina pectoris: Secondary | ICD-10-CM | POA: Diagnosis not present

## 2023-09-25 DIAGNOSIS — Z0130 Encounter for examination of blood pressure without abnormal findings: Secondary | ICD-10-CM

## 2023-09-25 NOTE — Progress Notes (Signed)
 Cardiology Office Note   Date:  09/25/2023   ID:  Scott Parker, DOB 1945/08/11, MRN [id number]  PCP:  Mario Reyes RAMAN, MD  Cardiologist:  Denyse Bathe, MD      History of Present Illness: Scott Parker is a 78 y.o. male who presents for  Chief Complaint  Patient presents with   Follow-up    2 month follow up    Had pneumonia, and gets SOB. He was in hospital, and going to see pulmonary.      Past Medical History:  Diagnosis Date   Arthritis    spinal column   COPD (chronic obstructive pulmonary disease) (HCC)    Coronary artery disease    Dyspnea    with exertion   Dysrhythmia    GERD (gastroesophageal reflux disease)    Gout    right foot   HOH (hard of hearing)    Hypercholesterolemia    Hyperlipemia    Hypertension    MRSA (methicillin resistant Staphylococcus aureus) 2016   HX of right lower leg   Wears dentures    full upper, partial lower     Past Surgical History:  Procedure Laterality Date   CARDIAC CATHETERIZATION  12/2019   CAROTID PTA/STENT INTERVENTION Left 06/28/2020   Procedure: CAROTID PTA/STENT INTERVENTION;  Surgeon: Jama Cordella MATSU, MD;  Location: [facility];  Service: Cardiovascular;  Laterality: Left;   CATARACT EXTRACTION W/PHACO Left 02/22/2020   Procedure: CATARACT EXTRACTION PHACO AND INTRAOCULAR LENS PLACEMENT (IOC) LEFT 4.45 00:41.2;  Surgeon: Jaye Fallow, MD;  Location: [facility];  Service: Ophthalmology;  Laterality: Left;   CATARACT EXTRACTION W/PHACO Right 03/14/2020   Procedure: CATARACT EXTRACTION PHACO AND INTRAOCULAR LENS PLACEMENT (IOC) RIGHT 6.06 00:50.4;  Surgeon: Jaye Fallow, MD;  Location: [facility];  Service: Ophthalmology;  Laterality: Right;   COLONOSCOPY     pt states he has had x 2   CORONARY STENT INTERVENTION N/A 11/03/2020   Procedure: CORONARY STENT INTERVENTION;  Surgeon: Darron Deatrice LABOR, MD;  Location: [facility];  Service: Cardiovascular;   Laterality: N/A;   fusion     cervical 3-7, repair ruptured disc   JOINT REPLACEMENT Bilateral    bilateral hip replacements   LEFT HEART CATH N/A 01/06/2020   Procedure: Left Heart Cath with possible intervention;  Surgeon: Bathe Denyse LABOR, MD;  Location: [facility];  Service: Cardiovascular;  Laterality: N/A;   LEFT HEART CATH AND CORONARY ANGIOGRAPHY Right 11/03/2020   Procedure: LEFT HEART CATH AND CORONARY ANGIOGRAPHY with Possible Interventon;  Surgeon: Bathe Denyse LABOR, MD;  Location: [facility];  Service: Cardiovascular;  Laterality: Right;   LEFT HEART CATH AND CORONARY ANGIOGRAPHY N/A 03/19/2022   Procedure: LEFT HEART CATH AND CORONARY ANGIOGRAPHY;  Surgeon: Bathe Denyse LABOR, MD;  Location: [facility];  Service: Cardiovascular;  Laterality: N/A;   LEFT HEART CATH AND CORONARY ANGIOGRAPHY Left 04/24/2023   Procedure: LEFT HEART CATH AND CORONARY ANGIOGRAPHY with coronary intervention;  Surgeon: Bathe Denyse LABOR, MD;  Location: [facility];  Service: Cardiovascular;  Laterality: Left;   TOTAL HIP ARTHROPLASTY     bilateral     Current Outpatient Medications  Medication Sig Dispense Refill   aspirin  EC 81 MG tablet Take 81 mg by mouth daily. Swallow whole.     atorvastatin  (LIPITOR ) 80 MG tablet Take 1 tablet (80 mg total) by mouth every evening. 90 tablet 0   budesonide-formoterol (SYMBICORT) 160-4.5  MCG/ACT inhaler Inhale 1 puff into the lungs daily.     Calcium  Carb-Cholecalciferol (CALCIUM  + D3 PO) Take 1 tablet by mouth daily.     calcium  carbonate (TUMS - DOSED IN MG ELEMENTAL CALCIUM ) 500 MG chewable tablet Chew 1,500-2,000 mg by mouth daily as needed for indigestion or heartburn.     CINNAMON PO Take 1,000 mg by mouth daily.     clopidogrel  (PLAVIX ) 75 MG tablet TAKE ONE TABLET BY MOUTH ONCE DAILY 90 tablet 0   ezetimibe (ZETIA) 10 MG tablet Take 10 mg by mouth daily.     GARLIC PO Take 1,000 mg by mouth daily.     isosorbide  mononitrate  (IMDUR ) 60 MG 24 hr tablet TAKE ONE TABLET BY MOUTH TWICE A DAY. 180 tablet 0   metoprolol  succinate (TOPROL -XL) 50 MG 24 hr tablet TAKE ONE TABLET BY MOUTH ONCE DAILY. TAKE WITH OR IMMEDIATELY FOLLOWING A MEAL. 30 tablet 11   Omega-3 Fatty Acids (FISH OIL) 1200 MG CAPS Take 1,200 mg by mouth daily.     pantoprazole  (PROTONIX ) 40 MG tablet Take 1 tablet (40 mg total) by mouth daily. 30 tablet 11   ranolazine  (RANEXA ) 1000 MG SR tablet TAKE ONE TABLET BY MOUTH TWICE DAILY 60 tablet 0   hydrochlorothiazide  (HYDRODIURIL ) 25 MG tablet Take 25 mg by mouth daily.     nitroGLYCERIN  (NITROSTAT ) 0.4 MG SL tablet Place 1 tablet (0.4 mg total) under the tongue every 5 (five) minutes as needed for chest pain. (Patient not taking: Reported on 09/25/2023) 100 tablet 3   zinc gluconate 50 MG tablet Take 50 mg by mouth 2 (two) times daily.     No current facility-administered medications for this visit.    Allergies:   Benazepril-hydrochlorothiazide     Social History:   reports that he quit smoking about 27 years ago. His smoking use included cigarettes. He has never used smokeless tobacco. He reports current alcohol use of about 12.0 standard drinks of alcohol per week. He reports that he does not use drugs.   Family History:  family history includes Hypertension in his mother.    ROS:     Review of Systems  Constitutional: Negative.   HENT: Negative.    Eyes: Negative.   Respiratory: Negative.    Gastrointestinal: Negative.   Genitourinary: Negative.   Musculoskeletal: Negative.   Skin: Negative.   Neurological: Negative.   Endo/Heme/Allergies: Negative.   Psychiatric/Behavioral: Negative.    All other systems reviewed and are negative.     All other systems are reviewed and negative.    PHYSICAL EXAM: VS:  BP 112/70   Pulse 74   Ht 5' 11 (1.803 m)   Wt 177 lb (80.3 kg)   SpO2 98%   BMI 24.69 kg/m  , BMI Body mass index is 24.69 kg/m. Last weight:  Wt Readings from Last 3  Encounters:  09/25/23 177 lb (80.3 kg)  08/12/23 175 lb (79.4 kg)  07/25/23 171 lb 9.6 oz (77.8 kg)     Physical Exam Vitals reviewed.  Constitutional:      Appearance: Normal appearance. He is normal weight.  HENT:     Head: Normocephalic.     Nose: Nose normal.     Mouth/Throat:     Mouth: Mucous membranes are moist.   Eyes:     Pupils: Pupils are equal, round, and reactive to light.    Cardiovascular:     Rate and Rhythm: Normal rate and regular rhythm.  Pulses: Normal pulses.     Heart sounds: Normal heart sounds.  Pulmonary:     Effort: Pulmonary effort is normal.  Abdominal:     General: Abdomen is flat. Bowel sounds are normal.   Musculoskeletal:        General: Normal range of motion.     Cervical back: Normal range of motion.   Skin:    General: Skin is warm.   Neurological:     General: No focal deficit present.     Mental Status: He is alert.   Psychiatric:        Mood and Affect: Mood normal.       EKG:   Recent Labs: 04/18/2023: ALT 27; BUN 14; Creatinine, Ser 1.04; Hemoglobin 15.0; Platelets 237; Potassium 3.9; Sodium 141    Lipid Panel No results found for: CHOL, TRIG, HDL, CHOLHDL, VLDL, LDLCALC, LDLDIRECT    Other studies Reviewed: Additional studies/ records that were reviewed today include:  Review of the above records demonstrates:       No data to display            ASSESSMENT AND PLAN:    ICD-10-CM   1. Coronary artery disease of native artery of native heart with stable angina pectoris (HCC)  I25.118    Stable    2. PAD (peripheral artery disease) (HCC)  I73.9     3. Effort angina (HCC)  I20.89    Infrequent chest pains    4. Mixed hyperlipidemia  E78.2        Problem List Items Addressed This Visit       Cardiovascular and Mediastinum   CAD (coronary artery disease) - Primary   Effort angina (HCC)   PAD (peripheral artery disease) (HCC)     Other   Hyperlipidemia        Disposition:   Return in about 3 months (around 12/26/2023).    Total time spent: 30 minutes  Signed,  Denyse Bathe, MD  09/25/2023 10:59 AM    [facility]

## 2023-09-26 ENCOUNTER — Ambulatory Visit: Admitting: Cardiovascular Disease

## 2023-10-07 ENCOUNTER — Ambulatory Visit: Admitting: Internal Medicine

## 2023-10-07 ENCOUNTER — Encounter: Payer: Self-pay | Admitting: Internal Medicine

## 2023-10-07 VITALS — BP 137/83 | HR 88 | Temp 98.1°F | Resp 16 | Ht 71.0 in | Wt 178.0 lb

## 2023-10-07 DIAGNOSIS — J4489 Other specified chronic obstructive pulmonary disease: Secondary | ICD-10-CM

## 2023-10-07 DIAGNOSIS — R911 Solitary pulmonary nodule: Secondary | ICD-10-CM

## 2023-10-07 DIAGNOSIS — R0602 Shortness of breath: Secondary | ICD-10-CM

## 2023-10-07 NOTE — Progress Notes (Signed)
 [facility] [address] [place], [place] [address]  Pulmonary Sleep Medicine   Office Visit Note  Patient Name: Scott Parker DOB: 05/07/1945 MRN [id number]  Date of Service: 10/07/2023  Complaints/HPI: Apparently back in April the patient states he was in the hospital with pneumonia. Patient states he has not had a follow up CXR done. He has some sputum which is clear. He states he has had no blood. He has no weight loss Denies having chest pain noted. Patient states he has been seeing his cardiologist regularly. He has had a CT chest done and this showed area of GGO. May represent resolving pneuonia but in a former smoker need to follow  Office Spirometry Results: Peak Flow: (!) 8 L/min FEV1: 2.6 liters FVC: 3.28 liters FEV1/FVC: 79.3 % FVC  % Predicted: 80 % FEV % Predicted: 88 % FeF 25-75: 2.1 liters FeF 25-75 % Predicted: 101   ROS  General: (-) fever, (-) chills, (-) night sweats, (-) weakness Skin: (-) rashes, (-) itching,. Eyes: (-) visual changes, (-) redness, (-) itching. Nose and Sinuses: (-) nasal stuffiness or itchiness, (-) postnasal drip, (-) nosebleeds, (-) sinus trouble. Mouth and Throat: (-) sore throat, (-) hoarseness. Neck: (-) swollen glands, (-) enlarged thyroid, (-) neck pain. Respiratory: - cough, (-) bloody sputum, + shortness of breath, - wheezing. Cardiovascular: - ankle swelling, (-) chest pain. Lymphatic: (-) lymph node enlargement. Neurologic: (-) numbness, (-) tingling. Psychiatric: (-) anxiety, (-) depression   Current Medication: Outpatient Encounter Medications as of 10/07/2023  Medication Sig   aspirin  EC 81 MG tablet Take 81 mg by mouth daily. Swallow whole.   atorvastatin  (LIPITOR ) 80 MG tablet Take 1 tablet (80 mg total) by mouth every evening.   budesonide-formoterol (SYMBICORT) 160-4.5 MCG/ACT inhaler Inhale 1 puff into the lungs daily.   Calcium  Carb-Cholecalciferol (CALCIUM  + D3 PO) Take 1 tablet by mouth daily.    calcium  carbonate (TUMS - DOSED IN MG ELEMENTAL CALCIUM ) 500 MG chewable tablet Chew 1,500-2,000 mg by mouth daily as needed for indigestion or heartburn.   CINNAMON PO Take 1,000 mg by mouth daily.   clopidogrel  (PLAVIX ) 75 MG tablet TAKE ONE TABLET BY MOUTH ONCE DAILY   ezetimibe (ZETIA) 10 MG tablet Take 10 mg by mouth daily.   GARLIC PO Take 1,000 mg by mouth daily.   hydrochlorothiazide  (HYDRODIURIL ) 25 MG tablet Take 25 mg by mouth daily.   isosorbide  mononitrate (IMDUR ) 60 MG 24 hr tablet TAKE ONE TABLET BY MOUTH TWICE A DAY.   metoprolol  succinate (TOPROL -XL) 50 MG 24 hr tablet TAKE ONE TABLET BY MOUTH ONCE DAILY. TAKE WITH OR IMMEDIATELY FOLLOWING A MEAL.   nitroGLYCERIN  (NITROSTAT ) 0.4 MG SL tablet Place 1 tablet (0.4 mg total) under the tongue every 5 (five) minutes as needed for chest pain.   Omega-3 Fatty Acids (FISH OIL) 1200 MG CAPS Take 1,200 mg by mouth daily.   pantoprazole  (PROTONIX ) 40 MG tablet Take 1 tablet (40 mg total) by mouth daily.   ranolazine  (RANEXA ) 1000 MG SR tablet TAKE ONE TABLET BY MOUTH TWICE DAILY   zinc gluconate 50 MG tablet Take 50 mg by mouth 2 (two) times daily.   No facility-administered encounter medications on file as of 10/07/2023.    Surgical History: Past Surgical History:  Procedure Laterality Date   CARDIAC CATHETERIZATION  12/2019   CAROTID PTA/STENT INTERVENTION Left 06/28/2020   Procedure: CAROTID PTA/STENT INTERVENTION;  Surgeon: Jama Cordella MATSU, MD;  Location: [facility];  Service:  Cardiovascular;  Laterality: Left;   CATARACT EXTRACTION W/PHACO Left 02/22/2020   Procedure: CATARACT EXTRACTION PHACO AND INTRAOCULAR LENS PLACEMENT (IOC) LEFT 4.45 00:41.2;  Surgeon: Jaye Fallow, MD;  Location: [facility];  Service: Ophthalmology;  Laterality: Left;   CATARACT EXTRACTION W/PHACO Right 03/14/2020   Procedure: CATARACT EXTRACTION PHACO AND INTRAOCULAR LENS PLACEMENT (IOC) RIGHT 6.06 00:50.4;  Surgeon: Jaye Fallow, MD;  Location: [facility];  Service: Ophthalmology;  Laterality: Right;   COLONOSCOPY     pt states he has had x 2   CORONARY STENT INTERVENTION N/A 11/03/2020   Procedure: CORONARY STENT INTERVENTION;  Surgeon: Darron Deatrice LABOR, MD;  Location: [facility];  Service: Cardiovascular;  Laterality: N/A;   fusion     cervical 3-7, repair ruptured disc   JOINT REPLACEMENT Bilateral    bilateral hip replacements   LEFT HEART CATH N/A 01/06/2020   Procedure: Left Heart Cath with possible intervention;  Surgeon: Fernand Denyse LABOR, MD;  Location: [facility];  Service: Cardiovascular;  Laterality: N/A;   LEFT HEART CATH AND CORONARY ANGIOGRAPHY Right 11/03/2020   Procedure: LEFT HEART CATH AND CORONARY ANGIOGRAPHY with Possible Interventon;  Surgeon: Fernand Denyse LABOR, MD;  Location: [facility];  Service: Cardiovascular;  Laterality: Right;   LEFT HEART CATH AND CORONARY ANGIOGRAPHY N/A 03/19/2022   Procedure: LEFT HEART CATH AND CORONARY ANGIOGRAPHY;  Surgeon: Fernand Denyse LABOR, MD;  Location: [facility];  Service: Cardiovascular;  Laterality: N/A;   LEFT HEART CATH AND CORONARY ANGIOGRAPHY Left 04/24/2023   Procedure: LEFT HEART CATH AND CORONARY ANGIOGRAPHY with coronary intervention;  Surgeon: Fernand Denyse LABOR, MD;  Location: [facility];  Service: Cardiovascular;  Laterality: Left;   TOTAL HIP ARTHROPLASTY     bilateral    Medical History: Past Medical History:  Diagnosis Date   Arthritis    spinal column   COPD (chronic obstructive pulmonary disease) (HCC)    Coronary artery disease    Dyspnea    with exertion   Dysrhythmia    GERD (gastroesophageal reflux disease)    Gout    right foot   HOH (hard of hearing)    Hypercholesterolemia    Hyperlipemia    Hypertension    MRSA (methicillin resistant Staphylococcus aureus) 2016   HX of right lower leg   Wears dentures    full upper, partial lower    Family History: Family  History  Problem Relation Age of Onset   Hypertension Mother     Social History: Social History   Socioeconomic History   Marital status: Divorced    Spouse name: Not on file   Number of children: Not on file   Years of education: Not on file   Highest education level: Not on file  Occupational History   Not on file  Tobacco Use   Smoking status: Former    Current packs/day: 0.00    Types: Cigarettes    Quit date: 10/1995    Years since quitting: 27.9   Smokeless tobacco: Never  Vaping Use   Vaping status: Never Used  Substance and Sexual Activity   Alcohol use: Yes    Alcohol/week: 12.0 standard drinks of alcohol    Types: 12 Cans of beer per week   Drug use: Never   Sexual activity: Not on file  Other Topics Concern   Not on file  Social History Narrative   Lives alone. No indoor pets.    Social  Drivers of Corporate investment banker Strain: Not on file  Food Insecurity: Not on file  Transportation Needs: Not on file  Physical Activity: Not on file  Stress: Not on file  Social Connections: Unknown (05/16/2022)   Received from [facility]   Social Network    Social Network: Not on file  Intimate Partner Violence: Unknown (05/16/2022)   Received from [facility]   HITS    Physically Hurt: Not on file    Insult or Talk Down To: Not on file    Threaten Physical Harm: Not on file    Scream or Curse: Not on file    Vital Signs: Blood pressure 137/83, pulse 88, temperature 98.1 F (36.7 C), resp. rate 16, height 5' 11 (1.803 m), weight 178 lb (80.7 kg), SpO2 99%.  Examination: General Appearance: The patient is well-developed, well-nourished, and in no distress. Skin: Gross inspection of skin unremarkable. Head: normocephalic, no gross deformities. Eyes: no gross deformities noted. ENT: ears appear grossly normal no exudates. Neck: Supple. No thyromegaly. No LAD. Respiratory: distant no rhonchi noted. Cardiovascular: Normal S1 and S2 without murmur or  rub. Extremities: No cyanosis. pulses are equal. Neurologic: Alert and oriented. No involuntary movements.  LABS: No results found for this or any previous visit (from the past 2160 hours).  Radiology: CT Chest Wo Contrast Result Date: 08/15/2023 CLINICAL DATA:  Multiple lung nodules. Bilateral pneumonia 2 months ago. Currently with productive cough and left chest pain. EXAM: CT CHEST WITHOUT CONTRAST TECHNIQUE: Multidetector CT imaging of the chest was performed following the standard protocol without IV contrast. RADIATION DOSE REDUCTION: This exam was performed according to the departmental dose-optimization program which includes automated exposure control, adjustment of the mA and/or kV according to patient size and/or use of iterative reconstruction technique. COMPARISON:  PA Lat chest 03/18/2022, and chest CT without contrast 10/20/2021. There are no more recent chest studies. FINDINGS: Cardiovascular: There are left main and moderate to heavy patchy three-vessel coronary artery calcifications, normal cardiac size. There is no pericardial effusion. The pulmonary arteries and veins are normal in caliber. The aorta is tortuous with patchy calcific plaque. There is no aortic aneurysm. Mediastinum/Nodes: There is a moderate-sized hiatal hernia. The thoracic esophagus is unremarkable. The lower poles of the thyroid are unremarkable. Axillary spaces are clear. No intrathoracic adenopathy is seen without contrast. The thoracic trachea, both main bronchi are clear. Lungs/Pleura: There are paraseptal and mild centrilobular emphysematous changes in both upper lobes, with no interval progression. There is scarring and calcifications in both extreme lung apices. There is new coarse linear peribronchovascular scarring and slight bronchiectasis in the anterior segment of the left upper lobe. There is chronic perifissural scarring and a stable chronic 5 mm nodule in the right middle lobe anteriorly on 3:84. There  is an 8 mm ground-glass nodule in the superior segment of the left lower lobe on 3:60, and just inferior to this there is a 1.3 x 0.8 cm ground-glass nodule on 3:66, both of which are in areas where there was denser infiltrate in 2023. It is possible these areas of ground-glass scarring, but this will need to be followed. Further down in left lower lobe there is a stable 7 mm subpleural nodule on 3:86 and a stable 5 mm pleural-based nodule on 3:104. In the medial lingula there is a stable 5 mm nodule on 3:101. The lungs are otherwise clear. There is no consolidation, effusion or pneumothorax. Upper Abdomen: No acute abnormality. Abdominal aortic atherosclerosis. Musculoskeletal: Mild  S shaped thoracic scoliosis with osteopenia and degenerative change. Chronic moderate to severe anterior wedge compression fracture T9 vertebral body. There are healed fracture deformities of several bilateral ribs, but there is also a transverse nondisplaced fracture of the anterior left sixth rib which appears recent or possibly even acute, although there is no associated pleural reaction or overlying soft tissue swelling. This is best seen on series 3 axial images 98-105. There are no other significant skeletal findings. No chest wall mass. IMPRESSION: 1. Recent or possibly acute nondisplaced transverse fracture of the anterior left sixth rib without associated pleural reaction, pneumothorax or overlying soft tissue swelling. 2. Emphysema with new coarse linear peribronchovascular scarring and slight bronchiectasis in the anterior segment of the left upper lobe. There previously was active airspace disease in this distribution. 3. A few bilateral stable subcentimeter nodules described above. 4. 8 mm and 1.3 x 0.8 cm ground-glass nodules in the superior segment of the left lower lobe, in areas where there was denser infiltrate in 2023. Possible these may be areas of ground-glass scarring, but this will need to be followed as  adenocarcinoma could appear similar. Non-contrast chest CT at 3-6 months is recommended. If nodules persist, subsequent management will be based upon the most suspicious nodule(s). This recommendation follows the consensus statement: Guidelines for Management of Incidental Pulmonary Nodules Detected on CT Images: From the Fleischner Society 2017; Radiology 2017; 284:228-243. 5. Aortic and coronary artery atherosclerosis. 6. Moderate-sized hiatal hernia. 7. Osteopenia, scoliosis and degenerative change. 8. Chronic moderate to severe anterior wedge compression fracture T9 vertebral body. Aortic Atherosclerosis (ICD10-I70.0) and Emphysema (ICD10-J43.9). Electronically Signed   By: Francis Quam M.D.   On: 08/15/2023 01:16    No results found.  No results found.  Assessment and Plan: Patient Active Problem List   Diagnosis Date Noted   PAD (peripheral artery disease) (HCC) 06/02/2023   Precordial pain 04/18/2023   Chest pain due to myocardial ischemia 04/18/2023   Coronary stent restenosis 04/18/2023   Abnormal nuclear stress test 04/18/2023   Effort angina (HCC) 11/03/2020   Unstable angina (HCC) 10/26/2020   Carotid stenosis 06/30/2020   CAD (coronary artery disease) 06/04/2020   Essential hypertension 06/04/2020   Hyperlipidemia 06/04/2020   GERD (gastroesophageal reflux disease) 06/04/2020   Fracture of phalanx of thumb 06/01/2020   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 Shortness of breath (Primary) Spirometry with flow-volume loop was done and results were discussed with the patient at length. - Spirometry with graph; Future  2. Obstructive chronic bronchitis without exacerbation (HCC) Patient is going to continue with current management patient has been on Symbicort albuterol .  Peers to be well-controlled.  3. Pulmonary nodule 1 cm or greater in diameter For his history of pulmonary nodule we will get a  follow-up CT scan of the chest done - CT Chest Wo Contrast; Future   General Counseling: I have discussed the findings of the evaluation and examination with Helayne.  I have also discussed any further diagnostic evaluation thatmay be needed or ordered today. Brace verbalizes understanding of the findings of todays visit. We also reviewed his medications today and discussed drug interactions and side effects including but not limited excessive drowsiness and altered mental states. We also discussed that there is always a risk not just to him but also people around him. he has been encouraged to call the office with any questions or concerns that should arise related to todays visit.  No orders of the defined types were placed in this encounter.    Time spent: 23  I have personally obtained a history, examined the patient, evaluated laboratory and imaging results, formulated the assessment and plan and placed orders.    Elfreda DELENA Bathe, MD [facility] Pulmonary and Critical Care Sleep medicine

## 2023-10-08 ENCOUNTER — Telehealth: Payer: Self-pay | Admitting: Internal Medicine

## 2023-10-08 ENCOUNTER — Other Ambulatory Visit: Payer: Self-pay

## 2023-10-08 DIAGNOSIS — R0602 Shortness of breath: Secondary | ICD-10-CM

## 2023-10-08 NOTE — Telephone Encounter (Signed)
Notified patient of CT appointment date, arrival time, location-Scott Parker 

## 2023-10-08 NOTE — Telephone Encounter (Signed)
 Patient will have pcp fax x-ray results-Toni

## 2023-10-09 ENCOUNTER — Other Ambulatory Visit: Payer: Self-pay | Admitting: Cardiovascular Disease

## 2023-10-09 DIAGNOSIS — E782 Mixed hyperlipidemia: Secondary | ICD-10-CM

## 2023-10-09 DIAGNOSIS — I2089 Other forms of angina pectoris: Secondary | ICD-10-CM

## 2023-10-09 DIAGNOSIS — I25118 Atherosclerotic heart disease of native coronary artery with other forms of angina pectoris: Secondary | ICD-10-CM

## 2023-10-09 DIAGNOSIS — I10 Essential (primary) hypertension: Secondary | ICD-10-CM

## 2023-10-09 DIAGNOSIS — I6523 Occlusion and stenosis of bilateral carotid arteries: Secondary | ICD-10-CM

## 2023-10-09 DIAGNOSIS — T82855A Stenosis of coronary artery stent, initial encounter: Secondary | ICD-10-CM

## 2023-10-13 ENCOUNTER — Ambulatory Visit
Admission: RE | Admit: 2023-10-13 | Discharge: 2023-10-13 | Disposition: A | Discharge status: Home / Self Care | Source: Ambulatory Visit | Attending: Internal Medicine | Admitting: Internal Medicine

## 2023-10-13 DIAGNOSIS — R911 Solitary pulmonary nodule: Secondary | ICD-10-CM | POA: Insufficient documentation

## 2023-10-21 LAB — PULMONARY FUNCTION TEST

## 2023-12-08 ENCOUNTER — Other Ambulatory Visit: Payer: Self-pay | Admitting: Cardiovascular Disease

## 2023-12-08 DIAGNOSIS — I25118 Atherosclerotic heart disease of native coronary artery with other forms of angina pectoris: Secondary | ICD-10-CM

## 2023-12-08 DIAGNOSIS — I6523 Occlusion and stenosis of bilateral carotid arteries: Secondary | ICD-10-CM

## 2023-12-08 DIAGNOSIS — T82855A Stenosis of coronary artery stent, initial encounter: Secondary | ICD-10-CM

## 2023-12-08 DIAGNOSIS — E782 Mixed hyperlipidemia: Secondary | ICD-10-CM

## 2023-12-08 DIAGNOSIS — I10 Essential (primary) hypertension: Secondary | ICD-10-CM

## 2023-12-08 DIAGNOSIS — I2089 Other forms of angina pectoris: Secondary | ICD-10-CM

## 2023-12-26 ENCOUNTER — Ambulatory Visit: Admitting: Cardiovascular Disease

## 2023-12-26 ENCOUNTER — Encounter: Payer: Self-pay | Admitting: Cardiovascular Disease

## 2023-12-26 VITALS — BP 124/70 | HR 73 | Ht 71.0 in | Wt 178.0 lb

## 2023-12-26 DIAGNOSIS — E782 Mixed hyperlipidemia: Secondary | ICD-10-CM | POA: Diagnosis not present

## 2023-12-26 DIAGNOSIS — R0789 Other chest pain: Secondary | ICD-10-CM

## 2023-12-26 DIAGNOSIS — I259 Chronic ischemic heart disease, unspecified: Secondary | ICD-10-CM

## 2023-12-26 DIAGNOSIS — I2089 Other forms of angina pectoris: Secondary | ICD-10-CM

## 2023-12-26 DIAGNOSIS — T82855A Stenosis of coronary artery stent, initial encounter: Secondary | ICD-10-CM

## 2023-12-26 DIAGNOSIS — I6523 Occlusion and stenosis of bilateral carotid arteries: Secondary | ICD-10-CM

## 2023-12-26 DIAGNOSIS — I10 Essential (primary) hypertension: Secondary | ICD-10-CM | POA: Diagnosis not present

## 2023-12-26 DIAGNOSIS — I739 Peripheral vascular disease, unspecified: Secondary | ICD-10-CM

## 2023-12-26 DIAGNOSIS — I25118 Atherosclerotic heart disease of native coronary artery with other forms of angina pectoris: Secondary | ICD-10-CM | POA: Diagnosis not present

## 2023-12-26 MED ORDER — NITROGLYCERIN 0.4 MG SL SUBL: 0.4000 mg | SUBLINGUAL_TABLET | SUBLINGUAL | 3 refills | Status: AC | PRN

## 2023-12-26 NOTE — Progress Notes (Signed)
 Cardiology Office Note   Date:  12/26/2023   ID:  Scott Parker, DOB 02-02-1946, MRN [id number]  PCP:  Mario Reyes RAMAN, MD  Cardiologist:  Denyse Bathe, MD      History of Present Illness: Scott Parker is a 78 y.o. male who presents for  Chief Complaint  Patient presents with   Follow-up    3 month follow up    Has occasional SOB. But has tightness in chest and arm pains.      Past Medical History:  Diagnosis Date   Arthritis    spinal column   COPD (chronic obstructive pulmonary disease) (HCC)    Coronary artery disease    Dyspnea    with exertion   Dysrhythmia    GERD (gastroesophageal reflux disease)    Gout    right foot   HOH (hard of hearing)    Hypercholesterolemia    Hyperlipemia    Hypertension    MRSA (methicillin resistant Staphylococcus aureus) 2016   HX of right lower leg   Wears dentures    full upper, partial lower     Past Surgical History:  Procedure Laterality Date   CARDIAC CATHETERIZATION  12/2019   CAROTID PTA/STENT INTERVENTION Left 06/28/2020   Procedure: CAROTID PTA/STENT INTERVENTION;  Surgeon: Jama Cordella MATSU, MD;  Location: [facility];  Service: Cardiovascular;  Laterality: Left;   CATARACT EXTRACTION W/PHACO Left 02/22/2020   Procedure: CATARACT EXTRACTION PHACO AND INTRAOCULAR LENS PLACEMENT (IOC) LEFT 4.45 00:41.2;  Surgeon: Jaye Fallow, MD;  Location: [facility];  Service: Ophthalmology;  Laterality: Left;   CATARACT EXTRACTION W/PHACO Right 03/14/2020   Procedure: CATARACT EXTRACTION PHACO AND INTRAOCULAR LENS PLACEMENT (IOC) RIGHT 6.06 00:50.4;  Surgeon: Jaye Fallow, MD;  Location: [facility];  Service: Ophthalmology;  Laterality: Right;   COLONOSCOPY     pt states he has had x 2   CORONARY STENT INTERVENTION N/A 11/03/2020   Procedure: CORONARY STENT INTERVENTION;  Surgeon: Darron Deatrice LABOR, MD;  Location: [facility];  Service: Cardiovascular;  Laterality: N/A;    fusion     cervical 3-7, repair ruptured disc   JOINT REPLACEMENT Bilateral    bilateral hip replacements   LEFT HEART CATH N/A 01/06/2020   Procedure: Left Heart Cath with possible intervention;  Surgeon: Bathe Denyse LABOR, MD;  Location: [facility];  Service: Cardiovascular;  Laterality: N/A;   LEFT HEART CATH AND CORONARY ANGIOGRAPHY Right 11/03/2020   Procedure: LEFT HEART CATH AND CORONARY ANGIOGRAPHY with Possible Interventon;  Surgeon: Bathe Denyse LABOR, MD;  Location: [facility];  Service: Cardiovascular;  Laterality: Right;   LEFT HEART CATH AND CORONARY ANGIOGRAPHY N/A 03/19/2022   Procedure: LEFT HEART CATH AND CORONARY ANGIOGRAPHY;  Surgeon: Bathe Denyse LABOR, MD;  Location: [facility];  Service: Cardiovascular;  Laterality: N/A;   LEFT HEART CATH AND CORONARY ANGIOGRAPHY Left 04/24/2023   Procedure: LEFT HEART CATH AND CORONARY ANGIOGRAPHY with coronary intervention;  Surgeon: Bathe Denyse LABOR, MD;  Location: [facility];  Service: Cardiovascular;  Laterality: Left;   TOTAL HIP ARTHROPLASTY     bilateral     Current Outpatient Medications  Medication Sig Dispense Refill   aspirin  EC 81 MG tablet Take 81 mg by mouth daily. Swallow whole.     atorvastatin  (LIPITOR ) 80 MG tablet Take 1 tablet (80 mg total) by mouth every evening. 90 tablet 0   budesonide-formoterol (SYMBICORT) 160-4.5 MCG/ACT inhaler Inhale  1 puff into the lungs daily.     Calcium  Carb-Cholecalciferol (CALCIUM  + D3 PO) Take 1 tablet by mouth daily.     calcium  carbonate (TUMS - DOSED IN MG ELEMENTAL CALCIUM ) 500 MG chewable tablet Chew 1,500-2,000 mg by mouth daily as needed for indigestion or heartburn.     CINNAMON PO Take 1,000 mg by mouth daily.     clopidogrel  (PLAVIX ) 75 MG tablet TAKE ONE TABLET BY MOUTH ONCE DAILY 90 tablet 0   ezetimibe (ZETIA) 10 MG tablet Take 10 mg by mouth daily.     GARLIC PO Take 1,000 mg by mouth daily.     hydrochlorothiazide  (HYDRODIURIL ) 25 MG  tablet Take 25 mg by mouth daily.     isosorbide  mononitrate (IMDUR ) 60 MG 24 hr tablet TAKE ONE TABLET BY MOUTH TWICE A DAY. 180 tablet 0   metoprolol  succinate (TOPROL -XL) 50 MG 24 hr tablet TAKE ONE TABLET BY MOUTH ONCE DAILY. TAKE WITH OR IMMEDIATELY FOLLOWING A MEAL. 30 tablet 11   nitroGLYCERIN  (NITROSTAT ) 0.4 MG SL tablet Place 1 tablet (0.4 mg total) under the tongue every 5 (five) minutes as needed for chest pain. 100 tablet 3   Omega-3 Fatty Acids (FISH OIL) 1200 MG CAPS Take 1,200 mg by mouth daily.     pantoprazole  (PROTONIX ) 40 MG tablet Take 1 tablet (40 mg total) by mouth daily. 30 tablet 11   ranolazine  (RANEXA ) 1000 MG SR tablet TAKE ONE TABLET BY MOUTH TWICE DAILY 60 tablet 0   zinc gluconate 50 MG tablet Take 50 mg by mouth 2 (two) times daily.     No current facility-administered medications for this visit.    Allergies:   Benazepril-hydrochlorothiazide     Social History:   reports that he quit smoking about 28 years ago. His smoking use included cigarettes. He has never used smokeless tobacco. He reports current alcohol use of about 12.0 standard drinks of alcohol per week. He reports that he does not use drugs.   Family History:  family history includes Hypertension in his mother.    ROS:     Review of Systems  Constitutional: Negative.   HENT: Negative.    Eyes: Negative.   Respiratory: Negative.    Gastrointestinal: Negative.   Genitourinary: Negative.   Musculoskeletal: Negative.   Skin: Negative.   Neurological: Negative.   Endo/Heme/Allergies: Negative.   Psychiatric/Behavioral: Negative.    All other systems reviewed and are negative.     All other systems are reviewed and negative.    PHYSICAL EXAM: VS:  BP 124/70   Pulse 73   Ht 5' 11 (1.803 m)   Wt 178 lb (80.7 kg)   SpO2 95%   BMI 24.83 kg/m  , BMI Body mass index is 24.83 kg/m. Last weight:  Wt Readings from Last 3 Encounters:  12/26/23 178 lb (80.7 kg)  10/07/23 178 lb (80.7 kg)   09/25/23 177 lb (80.3 kg)     Physical Exam Vitals reviewed.  Constitutional:      Appearance: Normal appearance. He is normal weight.  HENT:     Head: Normocephalic.     Nose: Nose normal.     Mouth/Throat:     Mouth: Mucous membranes are moist.  Eyes:     Pupils: Pupils are equal, round, and reactive to light.  Cardiovascular:     Rate and Rhythm: Normal rate and regular rhythm.     Pulses: Normal pulses.     Heart sounds: Normal heart sounds.  Pulmonary:  Effort: Pulmonary effort is normal.  Abdominal:     General: Abdomen is flat. Bowel sounds are normal.  Musculoskeletal:        General: Normal range of motion.     Cervical back: Normal range of motion.  Skin:    General: Skin is warm.  Neurological:     General: No focal deficit present.     Mental Status: He is alert.  Psychiatric:        Mood and Affect: Mood normal.       EKG:   Recent Labs: 04/18/2023: ALT 27; BUN 14; Creatinine, Ser 1.04; Hemoglobin 15.0; Platelets 237; Potassium 3.9; Sodium 141    Lipid Panel No results found for: CHOL, TRIG, HDL, CHOLHDL, VLDL, LDLCALC, LDLDIRECT    Other studies Reviewed: Additional studies/ records that were reviewed today include:  Review of the above records demonstrates:       No data to display            ASSESSMENT AND PLAN:    ICD-10-CM   1. Chest pain due to myocardial ischemia, unspecified ischemic chest pain type  I25.9 PCV ECHOCARDIOGRAM COMPLETE    MYOCARDIAL PERFUSION IMAGING   recurrent chest pain with stent in LAD, advise echo/stress test.    2. Coronary artery disease of native artery of native heart with stable angina pectoris  I25.118 PCV ECHOCARDIOGRAM COMPLETE    MYOCARDIAL PERFUSION IMAGING    nitroGLYCERIN  (NITROSTAT ) 0.4 MG SL tablet    3. Essential hypertension  I10 PCV ECHOCARDIOGRAM COMPLETE    MYOCARDIAL PERFUSION IMAGING    nitroGLYCERIN  (NITROSTAT ) 0.4 MG SL tablet    4. Mixed hyperlipidemia  E78.2  PCV ECHOCARDIOGRAM COMPLETE    MYOCARDIAL PERFUSION IMAGING    5. Coronary stent restenosis, initial encounter  T82.855A PCV ECHOCARDIOGRAM COMPLETE    MYOCARDIAL PERFUSION IMAGING    nitroGLYCERIN  (NITROSTAT ) 0.4 MG SL tablet    6. 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 Bilateral carotid artery stenosis  I65.23 PCV ECHOCARDIOGRAM COMPLETE    MYOCARDIAL PERFUSION IMAGING    nitroGLYCERIN  (NITROSTAT ) 0.4 MG SL tablet    8. PAD (peripheral artery disease)  I73.9 PCV ECHOCARDIOGRAM COMPLETE    MYOCARDIAL PERFUSION IMAGING    9. Effort angina  I20.89 nitroGLYCERIN  (NITROSTAT ) 0.4 MG SL tablet    10. Other chest pain  R07.89 nitroGLYCERIN  (NITROSTAT ) 0.4 MG SL tablet   Ranexa , isosrbide not helping, will do echo, stress test    11. 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, bilateral   Relevant Medications   nitroGLYCERIN  (NITROSTAT ) 0.4 MG SL tablet   Other Relevant Orders   PCV ECHOCARDIOGRAM COMPLETE   MYOCARDIAL PERFUSION IMAGING   CAD (coronary artery disease)   Relevant Medications   nitroGLYCERIN  (NITROSTAT ) 0.4 MG SL tablet   Other Relevant Orders   PCV ECHOCARDIOGRAM COMPLETE   MYOCARDIAL PERFUSION IMAGING   Essential hypertension   Relevant Medications   nitroGLYCERIN  (NITROSTAT ) 0.4 MG SL tablet   Other Relevant Orders   PCV ECHOCARDIOGRAM COMPLETE   MYOCARDIAL PERFUSION IMAGING   Carotid stenosis   Relevant Medications   nitroGLYCERIN  (NITROSTAT )  0.4 MG SL tablet   Other Relevant Orders   PCV ECHOCARDIOGRAM COMPLETE   MYOCARDIAL PERFUSION IMAGING   Effort angina   Relevant Medications   nitroGLYCERIN   (NITROSTAT ) 0.4 MG SL tablet   PAD (peripheral artery disease)   Relevant Medications   nitroGLYCERIN  (NITROSTAT ) 0.4 MG SL tablet   Other Relevant Orders   PCV ECHOCARDIOGRAM COMPLETE   MYOCARDIAL PERFUSION IMAGING     Other   Hyperlipidemia   Relevant Medications   nitroGLYCERIN  (NITROSTAT ) 0.4 MG SL tablet   Other Relevant Orders   PCV ECHOCARDIOGRAM COMPLETE   MYOCARDIAL PERFUSION IMAGING   Chest pain due to myocardial ischemia - Primary   Relevant Orders   PCV ECHOCARDIOGRAM COMPLETE   MYOCARDIAL PERFUSION IMAGING   Coronary stent restenosis   Relevant Medications   nitroGLYCERIN  (NITROSTAT ) 0.4 MG SL tablet   Other Relevant Orders   PCV ECHOCARDIOGRAM COMPLETE   MYOCARDIAL PERFUSION IMAGING   Other Visit Diagnoses       Other chest pain       Ranexa , isosrbide not helping, will do echo, stress test   Relevant Medications   nitroGLYCERIN  (NITROSTAT ) 0.4 MG SL tablet          Disposition:   Return in about 4 weeks (around 01/23/2024) for echo, stress test and f/u.    Total time spent: 35 minutes  Signed,  Denyse Bathe, MD  12/26/2023 10:42 AM    [facility]

## 2024-01-12 ENCOUNTER — Ambulatory Visit: Admitting: Internal Medicine

## 2024-01-12 ENCOUNTER — Encounter: Payer: Self-pay | Admitting: Internal Medicine

## 2024-01-12 VITALS — BP 162/87 | HR 86 | Temp 98.0°F | Resp 16 | Ht 71.0 in | Wt 180.0 lb

## 2024-01-12 DIAGNOSIS — I2510 Atherosclerotic heart disease of native coronary artery without angina pectoris: Secondary | ICD-10-CM

## 2024-01-12 DIAGNOSIS — R911 Solitary pulmonary nodule: Secondary | ICD-10-CM

## 2024-01-12 DIAGNOSIS — J4489 Other specified chronic obstructive pulmonary disease: Secondary | ICD-10-CM

## 2024-01-12 DIAGNOSIS — R0602 Shortness of breath: Secondary | ICD-10-CM | POA: Diagnosis not present

## 2024-01-12 DIAGNOSIS — I2583 Coronary atherosclerosis due to lipid rich plaque: Secondary | ICD-10-CM | POA: Diagnosis not present

## 2024-01-12 NOTE — Patient Instructions (Signed)

## 2024-01-12 NOTE — Progress Notes (Signed)
 [facility] [address] [place], [place] [address]  Pulmonary Sleep Medicine   Office Visit Note  Patient Name: Scott Parker DOB: 07/17/1945 MRN [id number]  Date of Service: 01/12/2024  Complaints/HPI: Overall doing well. No falre ups of breathing. He states that he had a head cold last week now resolved. History of pneumonia in April. He is not smoking. States he doeshave some second hand exposure. No chest pain noted no cough or congestion noted. Last PFT shows FEV1 74% states heis not taking symbicort lately  Office Spirometry Results:     ROS  General: (-) fever, (-) chills, (-) night sweats, (-) weakness Skin: (-) rashes, (-) itching,. Eyes: (-) visual changes, (-) redness, (-) itching. Nose and Sinuses: (-) nasal stuffiness or itchiness, (-) postnasal drip, (-) nosebleeds, (-) sinus trouble. Mouth and Throat: (-) sore throat, (-) hoarseness. Neck: (-) swollen glands, (-) enlarged thyroid, (-) neck pain. Respiratory: - cough, (-) bloody sputum, + shortness of breath, - wheezing. Cardiovascular: - ankle swelling, (-) chest pain. Lymphatic: (-) lymph node enlargement. Neurologic: (-) numbness, (-) tingling. Psychiatric: (-) anxiety, (-) depression   Current Medication: Outpatient Encounter Medications as of 01/12/2024  Medication Sig   aspirin  EC 81 MG tablet Take 81 mg by mouth daily. Swallow whole.   atorvastatin  (LIPITOR ) 80 MG tablet Take 1 tablet (80 mg total) by mouth every evening.   budesonide-formoterol (SYMBICORT) 160-4.5 MCG/ACT inhaler Inhale 1 puff into the lungs daily.   Calcium  Carb-Cholecalciferol (CALCIUM  + D3 PO) Take 1 tablet by mouth daily.   calcium  carbonate (TUMS - DOSED IN MG ELEMENTAL CALCIUM ) 500 MG chewable tablet Chew 1,500-2,000 mg by mouth daily as needed for indigestion or heartburn.   CINNAMON PO Take 1,000 mg by mouth daily.   clopidogrel  (PLAVIX ) 75 MG tablet TAKE ONE TABLET BY MOUTH ONCE DAILY   ezetimibe (ZETIA) 10  MG tablet Take 10 mg by mouth daily.   GARLIC PO Take 1,000 mg by mouth daily.   hydrochlorothiazide  (HYDRODIURIL ) 25 MG tablet Take 25 mg by mouth daily.   isosorbide  mononitrate (IMDUR ) 60 MG 24 hr tablet TAKE ONE TABLET BY MOUTH TWICE A DAY.   metoprolol  succinate (TOPROL -XL) 50 MG 24 hr tablet TAKE ONE TABLET BY MOUTH ONCE DAILY. TAKE WITH OR IMMEDIATELY FOLLOWING A MEAL.   nitroGLYCERIN  (NITROSTAT ) 0.4 MG SL tablet Place 1 tablet (0.4 mg total) under the tongue every 5 (five) minutes as needed for chest pain.   Omega-3 Fatty Acids (FISH OIL) 1200 MG CAPS Take 1,200 mg by mouth daily.   pantoprazole  (PROTONIX ) 40 MG tablet Take 1 tablet (40 mg total) by mouth daily.   ranolazine  (RANEXA ) 1000 MG SR tablet TAKE ONE TABLET BY MOUTH TWICE DAILY   zinc gluconate 50 MG tablet Take 50 mg by mouth 2 (two) times daily.   No facility-administered encounter medications on file as of 01/12/2024.    Surgical History: Past Surgical History:  Procedure Laterality Date   CARDIAC CATHETERIZATION  12/2019   CAROTID PTA/STENT INTERVENTION Left 06/28/2020   Procedure: CAROTID PTA/STENT INTERVENTION;  Surgeon: Jama Cordella MATSU, MD;  Location: [facility];  Service: Cardiovascular;  Laterality: Left;   CATARACT EXTRACTION W/PHACO Left 02/22/2020   Procedure: CATARACT EXTRACTION PHACO AND INTRAOCULAR LENS PLACEMENT (IOC) LEFT 4.45 00:41.2;  Surgeon: Jaye Fallow, MD;  Location: [facility];  Service: Ophthalmology;  Laterality: Left;   CATARACT EXTRACTION W/PHACO Right 03/14/2020   Procedure: CATARACT EXTRACTION PHACO AND INTRAOCULAR LENS PLACEMENT (IOC)  RIGHT 6.06 00:50.4;  Surgeon: Jaye Fallow, MD;  Location: [facility];  Service: Ophthalmology;  Laterality: Right;   COLONOSCOPY     pt states he has had x 2   CORONARY STENT INTERVENTION N/A 11/03/2020   Procedure: CORONARY STENT INTERVENTION;  Surgeon: Darron Deatrice LABOR, MD;  Location: [facility];  Service:  Cardiovascular;  Laterality: N/A;   fusion     cervical 3-7, repair ruptured disc   JOINT REPLACEMENT Bilateral    bilateral hip replacements   LEFT HEART CATH N/A 01/06/2020   Procedure: Left Heart Cath with possible intervention;  Surgeon: Fernand Denyse LABOR, MD;  Location: [facility];  Service: Cardiovascular;  Laterality: N/A;   LEFT HEART CATH AND CORONARY ANGIOGRAPHY Right 11/03/2020   Procedure: LEFT HEART CATH AND CORONARY ANGIOGRAPHY with Possible Interventon;  Surgeon: Fernand Denyse LABOR, MD;  Location: [facility];  Service: Cardiovascular;  Laterality: Right;   LEFT HEART CATH AND CORONARY ANGIOGRAPHY N/A 03/19/2022   Procedure: LEFT HEART CATH AND CORONARY ANGIOGRAPHY;  Surgeon: Fernand Denyse LABOR, MD;  Location: [facility];  Service: Cardiovascular;  Laterality: N/A;   LEFT HEART CATH AND CORONARY ANGIOGRAPHY Left 04/24/2023   Procedure: LEFT HEART CATH AND CORONARY ANGIOGRAPHY with coronary intervention;  Surgeon: Fernand Denyse LABOR, MD;  Location: [facility];  Service: Cardiovascular;  Laterality: Left;   TOTAL HIP ARTHROPLASTY     bilateral    Medical History: Past Medical History:  Diagnosis Date   Arthritis    spinal column   COPD (chronic obstructive pulmonary disease) (HCC)    Coronary artery disease    Dyspnea    with exertion   Dysrhythmia    GERD (gastroesophageal reflux disease)    Gout    right foot   HOH (hard of hearing)    Hypercholesterolemia    Hyperlipemia    Hypertension    MRSA (methicillin resistant Staphylococcus aureus) 2016   HX of right lower leg   Wears dentures    full upper, partial lower    Family History: Family History  Problem Relation Age of Onset   Hypertension Mother     Social History: Social History   Socioeconomic History   Marital status: Divorced    Spouse name: Not on file   Number of children: Not on file   Years of education: Not on file   Highest education level: Not on file   Occupational History   Not on file  Tobacco Use   Smoking status: Former    Current packs/day: 0.00    Types: Cigarettes    Quit date: 10/1995    Years since quitting: 28.2   Smokeless tobacco: Never  Vaping Use   Vaping status: Never Used  Substance and Sexual Activity   Alcohol use: Yes    Alcohol/week: 12.0 standard drinks of alcohol    Types: 12 Cans of beer per week   Drug use: Never   Sexual activity: Not on file  Other Topics Concern   Not on file  Social History Narrative   Lives alone. No indoor pets.    Social Drivers of Corporate investment banker Strain: Not on file  Food Insecurity: Not on file  Transportation Needs: Not on file  Physical Activity: Not on file  Stress: Not on file  Social Connections: Unknown (05/16/2022)   Received from [facility]   Social Network    Social Network: Not on file  Intimate  Partner Violence: Unknown (05/16/2022)   Received from [facility]   HITS    Physically Hurt: Not on file    Insult or Talk Down To: Not on file    Threaten Physical Harm: Not on file    Scream or Curse: Not on file    Vital Signs: Blood pressure (!) 162/87, pulse 86, temperature 98 F (36.7 C), resp. rate 16, height 5' 11 (1.803 m), weight 180 lb (81.6 kg), SpO2 100%.  Examination: General Appearance: The patient is well-developed, well-nourished, and in no distress. Skin: Gross inspection of skin unremarkable. Head: normocephalic, no gross deformities. Eyes: no gross deformities noted. ENT: ears appear grossly normal no exudates. Neck: Supple. No thyromegaly. No LAD. Respiratory: no rhonchi noted. Cardiovascular: Normal S1 and S2 without murmur or rub. Extremities: No cyanosis. pulses are equal. Neurologic: Alert and oriented. No involuntary movements.  LABS: Recent Results (from the past 2160 hours)  Pulmonary Function Test     Status: None   Collection Time: 10/21/23 10:41 AM  Result Value Ref Range   FEV1     FVC     FEV1/FVC      TLC     DLCO      Radiology: CT Chest Wo Contrast Result Date: 10/13/2023 CLINICAL DATA:  Follow-up pulmonary nodules EXAM: CT CHEST WITHOUT CONTRAST TECHNIQUE: Multidetector CT imaging of the chest was performed following the standard protocol without IV contrast. RADIATION DOSE REDUCTION: This exam was performed according to the departmental dose-optimization program which includes automated exposure control, adjustment of the mA and/or kV according to patient size and/or use of iterative reconstruction technique. COMPARISON:  08/14/2023 FINDINGS: Cardiovascular: Atherosclerotic calcifications of the aorta are noted. No aneurysmal dilatation is seen. Heart is not significantly enlarged in size. Coronary calcifications are noted. Mediastinum/Nodes: Thoracic inlet is within normal limits. No hilar or mediastinal adenopathy is noted. The esophagus is within normal limits as visualized. Lungs/Pleura: Lungs are well aerated bilaterally. The previously seen ground-glass densities in the left lower lobe are not well appreciated on this exam and were likely postinflammatory in nature. No focal confluent infiltrate or sizable effusion is seen. Stable nodules are noted within the left lung best seen on images 107, 97, 84 and 97. These are all stable from the prior exam as well as previous studies dating back to 2023 consistent with a benign etiology. Stable nodule in the right middle lobe is noted on image number 80 of series 3 along the fissure stable from 2023 also consistent with a benign etiology. No new focal nodules are seen. Upper Abdomen: Visualized upper abdomen is within normal limits. Musculoskeletal: Multiple old healed rib fractures are noted particularly on the left. Chronic T9 compression deformity is noted. IMPRESSION: Resolution of previously seen ground-glass nodules. These were likely postinflammatory in nature. Stable nodules from 2023 in both lungs which require no specific follow-up given  their stability. Aortic Atherosclerosis (ICD10-I70.0) and Emphysema (ICD10-J43.9). Electronically Signed   By: Oneil Devonshire M.D.   On: 10/13/2023 20:33    No results found.  No results found.  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 Obstructive chronic bronchitis without exacerbation (HCC) (Primary) Will get follow up spirometry. He will continue with symbicort. Avoid second hand smoke. Refuses flu shot today has had covid shot  2. Pulmonary nodule 1 cm or greater in diameter Follow up CT scan shows resolution of previously noted nodules. Former smoker quit August of 1997  3. Coronary artery disease due to lipid rich plaque To have stress myoview  4. Shortness of breath Will follow up PFT spiro today   General Counseling: I have discussed the findings of the evaluation and examination with Helayne.  I have also discussed any further diagnostic evaluation thatmay be needed or ordered today. Baudelio verbalizes understanding of the findings of todays visit. We also reviewed his medications today and discussed drug interactions and side effects including but not limited excessive drowsiness and altered mental states. We also discussed that there is always a risk not just to him but also people around him. he has been encouraged to call the office with  any questions or concerns that should arise related to todays visit.  No orders of the defined types were placed in this encounter.    Time spent: 75  I have personally obtained a history, examined the patient, evaluated laboratory and imaging results, formulated the assessment and plan and placed orders.    Elfreda DELENA Bathe, MD [facility] Pulmonary and Critical Care Sleep medicine

## 2024-01-27 ENCOUNTER — Other Ambulatory Visit

## 2024-01-29 ENCOUNTER — Ambulatory Visit

## 2024-01-29 DIAGNOSIS — I25118 Atherosclerotic heart disease of native coronary artery with other forms of angina pectoris: Secondary | ICD-10-CM

## 2024-01-29 DIAGNOSIS — E782 Mixed hyperlipidemia: Secondary | ICD-10-CM

## 2024-01-29 DIAGNOSIS — I259 Chronic ischemic heart disease, unspecified: Secondary | ICD-10-CM

## 2024-01-29 DIAGNOSIS — T82855A Stenosis of coronary artery stent, initial encounter: Secondary | ICD-10-CM

## 2024-01-29 DIAGNOSIS — I6523 Occlusion and stenosis of bilateral carotid arteries: Secondary | ICD-10-CM

## 2024-01-29 DIAGNOSIS — I10 Essential (primary) hypertension: Secondary | ICD-10-CM

## 2024-01-29 DIAGNOSIS — I739 Peripheral vascular disease, unspecified: Secondary | ICD-10-CM

## 2024-01-29 MED ORDER — TECHNETIUM TC 99M SESTAMIBI GENERIC - CARDIOLITE
10.8000 | Freq: Once | INTRAVENOUS | Status: AC | PRN
Start: 2024-01-29 — End: 2024-01-29
  Administered 2024-01-29: 10.8 via INTRAVENOUS

## 2024-02-02 ENCOUNTER — Ambulatory Visit (INDEPENDENT_AMBULATORY_CARE_PROVIDER_SITE_OTHER)

## 2024-02-02 DIAGNOSIS — T82855A Stenosis of coronary artery stent, initial encounter: Secondary | ICD-10-CM

## 2024-02-02 DIAGNOSIS — I361 Nonrheumatic tricuspid (valve) insufficiency: Secondary | ICD-10-CM | POA: Diagnosis not present

## 2024-02-02 DIAGNOSIS — I10 Essential (primary) hypertension: Secondary | ICD-10-CM

## 2024-02-02 DIAGNOSIS — E782 Mixed hyperlipidemia: Secondary | ICD-10-CM

## 2024-02-02 DIAGNOSIS — I340 Nonrheumatic mitral (valve) insufficiency: Secondary | ICD-10-CM

## 2024-02-02 DIAGNOSIS — I6523 Occlusion and stenosis of bilateral carotid arteries: Secondary | ICD-10-CM

## 2024-02-02 DIAGNOSIS — I351 Nonrheumatic aortic (valve) insufficiency: Secondary | ICD-10-CM

## 2024-02-02 DIAGNOSIS — I259 Chronic ischemic heart disease, unspecified: Secondary | ICD-10-CM

## 2024-02-02 DIAGNOSIS — I739 Peripheral vascular disease, unspecified: Secondary | ICD-10-CM

## 2024-02-02 DIAGNOSIS — I25118 Atherosclerotic heart disease of native coronary artery with other forms of angina pectoris: Secondary | ICD-10-CM

## 2024-02-03 ENCOUNTER — Ambulatory Visit: Admitting: Cardiovascular Disease

## 2024-02-03 ENCOUNTER — Encounter: Payer: Self-pay | Admitting: Cardiovascular Disease

## 2024-02-03 VITALS — BP 117/78 | HR 80 | Ht 71.0 in | Wt 178.4 lb

## 2024-02-03 DIAGNOSIS — I6523 Occlusion and stenosis of bilateral carotid arteries: Secondary | ICD-10-CM

## 2024-02-03 DIAGNOSIS — I25118 Atherosclerotic heart disease of native coronary artery with other forms of angina pectoris: Secondary | ICD-10-CM

## 2024-02-03 DIAGNOSIS — R0602 Shortness of breath: Secondary | ICD-10-CM | POA: Diagnosis not present

## 2024-02-03 DIAGNOSIS — I10 Essential (primary) hypertension: Secondary | ICD-10-CM

## 2024-02-03 DIAGNOSIS — E782 Mixed hyperlipidemia: Secondary | ICD-10-CM | POA: Diagnosis not present

## 2024-02-03 DIAGNOSIS — T82855A Stenosis of coronary artery stent, initial encounter: Secondary | ICD-10-CM

## 2024-02-03 NOTE — Progress Notes (Signed)
 Cardiology Office Note   Date:  02/03/2024   ID:  JERREL TIBERIO, DOB 1946/03/21, MRN [id number]  PCP:  Mario Reyes RAMAN, MD  Cardiologist:  Denyse Bathe, MD      History of Present Illness: Scott Parker is a 78 y.o. male who presents for  Chief Complaint  Patient presents with   Follow-up    Echo & nst results    Has SOB, no chest pains.      Past Medical History:  Diagnosis Date   Arthritis    spinal column   COPD (chronic obstructive pulmonary disease) (HCC)    Coronary artery disease    Dyspnea    with exertion   Dysrhythmia    GERD (gastroesophageal reflux disease)    Gout    right foot   HOH (hard of hearing)    Hypercholesterolemia    Hyperlipemia    Hypertension    MRSA (methicillin resistant Staphylococcus aureus) 2016   HX of right lower leg   Wears dentures    full upper, partial lower     Past Surgical History:  Procedure Laterality Date   CARDIAC CATHETERIZATION  12/2019   CAROTID PTA/STENT INTERVENTION Left 06/28/2020   Procedure: CAROTID PTA/STENT INTERVENTION;  Surgeon: Jama Cordella MATSU, MD;  Location: [facility];  Service: Cardiovascular;  Laterality: Left;   CATARACT EXTRACTION W/PHACO Left 02/22/2020   Procedure: CATARACT EXTRACTION PHACO AND INTRAOCULAR LENS PLACEMENT (IOC) LEFT 4.45 00:41.2;  Surgeon: Jaye Fallow, MD;  Location: [facility];  Service: Ophthalmology;  Laterality: Left;   CATARACT EXTRACTION W/PHACO Right 03/14/2020   Procedure: CATARACT EXTRACTION PHACO AND INTRAOCULAR LENS PLACEMENT (IOC) RIGHT 6.06 00:50.4;  Surgeon: Jaye Fallow, MD;  Location: [facility];  Service: Ophthalmology;  Laterality: Right;   COLONOSCOPY     pt states he has had x 2   CORONARY STENT INTERVENTION N/A 11/03/2020   Procedure: CORONARY STENT INTERVENTION;  Surgeon: Darron Deatrice LABOR, MD;  Location: [facility];  Service: Cardiovascular;  Laterality: N/A;   fusion     cervical 3-7, repair  ruptured disc   JOINT REPLACEMENT Bilateral    bilateral hip replacements   LEFT HEART CATH N/A 01/06/2020   Procedure: Left Heart Cath with possible intervention;  Surgeon: Bathe Denyse LABOR, MD;  Location: [facility];  Service: Cardiovascular;  Laterality: N/A;   LEFT HEART CATH AND CORONARY ANGIOGRAPHY Right 11/03/2020   Procedure: LEFT HEART CATH AND CORONARY ANGIOGRAPHY with Possible Interventon;  Surgeon: Bathe Denyse LABOR, MD;  Location: [facility];  Service: Cardiovascular;  Laterality: Right;   LEFT HEART CATH AND CORONARY ANGIOGRAPHY N/A 03/19/2022   Procedure: LEFT HEART CATH AND CORONARY ANGIOGRAPHY;  Surgeon: Bathe Denyse LABOR, MD;  Location: [facility];  Service: Cardiovascular;  Laterality: N/A;   LEFT HEART CATH AND CORONARY ANGIOGRAPHY Left 04/24/2023   Procedure: LEFT HEART CATH AND CORONARY ANGIOGRAPHY with coronary intervention;  Surgeon: Bathe Denyse LABOR, MD;  Location: [facility];  Service: Cardiovascular;  Laterality: Left;   TOTAL HIP ARTHROPLASTY     bilateral     Current Outpatient Medications  Medication Sig Dispense Refill   aspirin  EC 81 MG tablet Take 81 mg by mouth daily. Swallow whole.     atorvastatin  (LIPITOR ) 80 MG tablet Take 1 tablet (80 mg total) by mouth every evening. 90 tablet 0   budesonide-formoterol (SYMBICORT) 160-4.5 MCG/ACT inhaler Inhale 1 puff into the lungs daily.  Calcium  Carb-Cholecalciferol (CALCIUM  + D3 PO) Take 1 tablet by mouth daily.     calcium  carbonate (TUMS - DOSED IN MG ELEMENTAL CALCIUM ) 500 MG chewable tablet Chew 1,500-2,000 mg by mouth daily as needed for indigestion or heartburn.     CINNAMON PO Take 1,000 mg by mouth daily.     clopidogrel  (PLAVIX ) 75 MG tablet TAKE ONE TABLET BY MOUTH ONCE DAILY 90 tablet 0   ezetimibe (ZETIA) 10 MG tablet Take 10 mg by mouth daily.     GARLIC PO Take 1,000 mg by mouth daily.     hydrochlorothiazide  (HYDRODIURIL ) 25 MG tablet Take 25 mg by mouth daily.      isosorbide  mononitrate (IMDUR ) 60 MG 24 hr tablet TAKE ONE TABLET BY MOUTH TWICE A DAY. 180 tablet 0   metoprolol  succinate (TOPROL -XL) 50 MG 24 hr tablet TAKE ONE TABLET BY MOUTH ONCE DAILY. TAKE WITH OR IMMEDIATELY FOLLOWING A MEAL. 30 tablet 11   nitroGLYCERIN  (NITROSTAT ) 0.4 MG SL tablet Place 1 tablet (0.4 mg total) under the tongue every 5 (five) minutes as needed for chest pain. 100 tablet 3   Omega-3 Fatty Acids (FISH OIL) 1200 MG CAPS Take 1,200 mg by mouth daily.     pantoprazole  (PROTONIX ) 40 MG tablet Take 1 tablet (40 mg total) by mouth daily. 30 tablet 11   ranolazine  (RANEXA ) 1000 MG SR tablet TAKE ONE TABLET BY MOUTH TWICE DAILY 60 tablet 0   zinc gluconate 50 MG tablet Take 50 mg by mouth 2 (two) times daily.     No current facility-administered medications for this visit.    Allergies:   Benazepril-hydrochlorothiazide     Social History:   reports that he quit smoking about 28 years ago. His smoking use included cigarettes. He has never used smokeless tobacco. He reports current alcohol use of about 12.0 standard drinks of alcohol per week. He reports that he does not use drugs.   Family History:  family history includes Hypertension in his mother.    ROS:     Review of Systems  Constitutional: Negative.   HENT: Negative.    Eyes: Negative.   Respiratory: Negative.    Gastrointestinal: Negative.   Genitourinary: Negative.   Musculoskeletal: Negative.   Skin: Negative.   Neurological: Negative.   Endo/Heme/Allergies: Negative.   Psychiatric/Behavioral: Negative.    All other systems reviewed and are negative.     All other systems are reviewed and negative.    PHYSICAL EXAM: VS:  BP 117/78   Pulse 80   Ht 5' 11 (1.803 m)   Wt 178 lb 6.4 oz (80.9 kg)   SpO2 96%   BMI 24.88 kg/m  , BMI Body mass index is 24.88 kg/m. Last weight:  Wt Readings from Last 3 Encounters:  02/03/24 178 lb 6.4 oz (80.9 kg)  01/12/24 180 lb (81.6 kg)  12/26/23 178 lb (80.7  kg)     Physical Exam Vitals reviewed.  Constitutional:      Appearance: Normal appearance. He is normal weight.  HENT:     Head: Normocephalic.     Nose: Nose normal.     Mouth/Throat:     Mouth: Mucous membranes are moist.  Eyes:     Pupils: Pupils are equal, round, and reactive to light.  Cardiovascular:     Rate and Rhythm: Normal rate and regular rhythm.     Pulses: Normal pulses.     Heart sounds: Normal heart sounds.  Pulmonary:     Effort: Pulmonary  effort is normal.  Abdominal:     General: Abdomen is flat. Bowel sounds are normal.  Musculoskeletal:        General: Normal range of motion.     Cervical back: Normal range of motion.  Skin:    General: Skin is warm.  Neurological:     General: No focal deficit present.     Mental Status: He is alert.  Psychiatric:        Mood and Affect: Mood normal.       EKG:   Recent Labs: 04/18/2023: ALT 27; BUN 14; Creatinine, Ser 1.04; Hemoglobin 15.0; Platelets 237; Potassium 3.9; Sodium 141    Lipid Panel No results found for: CHOL, TRIG, HDL, CHOLHDL, VLDL, LDLCALC, LDLDIRECT    Other studies Reviewed: Additional studies/ records that were reviewed today include:  Review of the above records demonstrates:       No data to display            ASSESSMENT AND PLAN:    ICD-10-CM   1. Coronary stent restenosis, initial encounter  T82.855A    stress test had no ischaemia, and ECHO had normal EF, mild AR.    2. Coronary artery disease of native artery of native heart with stable angina pectoris  I25.118     3. Essential hypertension  I10     4. Mixed hyperlipidemia  E78.2     5. 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    Total time spent: 30 minutes  Signed,  Denyse Bathe, MD  02/03/2024 10:38 AM    [facility]

## 2024-02-11 ENCOUNTER — Telehealth: Payer: Self-pay

## 2024-02-12 NOTE — Telephone Encounter (Signed)
 Do they have advair or Dulera

## 2024-02-13 ENCOUNTER — Telehealth: Payer: Self-pay

## 2024-02-13 ENCOUNTER — Other Ambulatory Visit: Payer: Self-pay

## 2024-02-13 MED ORDER — BUDESONIDE-FORMOTEROL FUMARATE 160-4.5 MCG/ACT IN AERO: 1.0000 | INHALATION_SPRAY | Freq: Every day | RESPIRATORY_TRACT | 3 refills | Status: DC

## 2024-02-13 MED ORDER — BREZTRI AEROSPHERE 160-9-4.8 MCG/ACT IN AERO: 2.0000 | INHALATION_SPRAY | Freq: Two times a day (BID) | RESPIRATORY_TRACT | 11 refills | Status: AC

## 2024-02-13 NOTE — Telephone Encounter (Signed)
 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 After speaking to Algood from [employer], [employer] may work, [facility] approved as well. Will await confirmation form to come through via fax for signature.

## 2024-02-17 ENCOUNTER — Telehealth: Payer: Self-pay

## 2024-02-17 NOTE — Telephone Encounter (Signed)
 Faxed patient's AZ&ME form for patient's Breztri.

## 2024-03-09 ENCOUNTER — Other Ambulatory Visit: Payer: Self-pay | Admitting: Cardiovascular Disease

## 2024-03-09 DIAGNOSIS — I2089 Other forms of angina pectoris: Secondary | ICD-10-CM

## 2024-03-09 DIAGNOSIS — T82855A Stenosis of coronary artery stent, initial encounter: Secondary | ICD-10-CM

## 2024-03-09 DIAGNOSIS — I6523 Occlusion and stenosis of bilateral carotid arteries: Secondary | ICD-10-CM

## 2024-03-09 DIAGNOSIS — E782 Mixed hyperlipidemia: Secondary | ICD-10-CM

## 2024-03-09 DIAGNOSIS — I25118 Atherosclerotic heart disease of native coronary artery with other forms of angina pectoris: Secondary | ICD-10-CM

## 2024-03-09 DIAGNOSIS — I10 Essential (primary) hypertension: Secondary | ICD-10-CM

## 2024-05-06 ENCOUNTER — Ambulatory Visit: Admitting: Cardiovascular Disease

## 2024-05-06 ENCOUNTER — Encounter: Payer: Self-pay | Admitting: Cardiovascular Disease

## 2024-05-06 VITALS — BP 140/86 | HR 57 | Ht 71.0 in | Wt 181.0 lb

## 2024-05-06 DIAGNOSIS — R0789 Other chest pain: Secondary | ICD-10-CM

## 2024-05-06 DIAGNOSIS — I6523 Occlusion and stenosis of bilateral carotid arteries: Secondary | ICD-10-CM | POA: Diagnosis not present

## 2024-05-06 DIAGNOSIS — I739 Peripheral vascular disease, unspecified: Secondary | ICD-10-CM | POA: Diagnosis not present

## 2024-05-06 DIAGNOSIS — I25118 Atherosclerotic heart disease of native coronary artery with other forms of angina pectoris: Secondary | ICD-10-CM

## 2024-05-06 DIAGNOSIS — E782 Mixed hyperlipidemia: Secondary | ICD-10-CM

## 2024-05-06 DIAGNOSIS — I10 Essential (primary) hypertension: Secondary | ICD-10-CM | POA: Diagnosis not present

## 2024-05-06 DIAGNOSIS — R0602 Shortness of breath: Secondary | ICD-10-CM | POA: Diagnosis not present

## 2024-05-06 DIAGNOSIS — T82855A Stenosis of coronary artery stent, initial encounter: Secondary | ICD-10-CM

## 2024-05-06 NOTE — Progress Notes (Signed)
 "     Cardiology Office Note   Date:  05/06/2024   ID:  Scott Parker, DOB Feb 08, 1946, MRN [id number]  PCP:  Mario Reyes RAMAN, MD  Cardiologist:  Denyse Bathe, MD      History of Present Illness: Scott Parker is a 79 y.o. male who presents for  Chief Complaint  Patient presents with   Follow-up    3 month follow up    sharp chest pains.      Past Medical History:  Diagnosis Date   Arthritis    spinal column   COPD (chronic obstructive pulmonary disease) (HCC)    Coronary artery disease    Dyspnea    with exertion   Dysrhythmia    GERD (gastroesophageal reflux disease)    Gout    right foot   HOH (hard of hearing)    Hypercholesterolemia    Hyperlipemia    Hypertension    MRSA (methicillin resistant Staphylococcus aureus) 2016   HX of right lower leg   Wears dentures    full upper, partial lower     Past Surgical History:  Procedure Laterality Date   CARDIAC CATHETERIZATION  12/2019   CAROTID PTA/STENT INTERVENTION Left 06/28/2020   Procedure: CAROTID PTA/STENT INTERVENTION;  Surgeon: Jama Cordella MATSU, MD;  Location: [facility];  Service: Cardiovascular;  Laterality: Left;   CATARACT EXTRACTION W/PHACO Left 02/22/2020   Procedure: CATARACT EXTRACTION PHACO AND INTRAOCULAR LENS PLACEMENT (IOC) LEFT 4.45 00:41.2;  Surgeon: Jaye Fallow, MD;  Location: [facility];  Service: Ophthalmology;  Laterality: Left;   CATARACT EXTRACTION W/PHACO Right 03/14/2020   Procedure: CATARACT EXTRACTION PHACO AND INTRAOCULAR LENS PLACEMENT (IOC) RIGHT 6.06 00:50.4;  Surgeon: Jaye Fallow, MD;  Location: [facility];  Service: Ophthalmology;  Laterality: Right;   COLONOSCOPY     pt states he has had x 2   CORONARY STENT INTERVENTION N/A 11/03/2020   Procedure: CORONARY STENT INTERVENTION;  Surgeon: Darron Deatrice LABOR, MD;  Location: [facility];  Service: Cardiovascular;  Laterality: N/A;   fusion     cervical 3-7, repair ruptured  disc   JOINT REPLACEMENT Bilateral    bilateral hip replacements   LEFT HEART CATH N/A 01/06/2020   Procedure: Left Heart Cath with possible intervention;  Surgeon: Bathe Denyse LABOR, MD;  Location: [facility];  Service: Cardiovascular;  Laterality: N/A;   LEFT HEART CATH AND CORONARY ANGIOGRAPHY Right 11/03/2020   Procedure: LEFT HEART CATH AND CORONARY ANGIOGRAPHY with Possible Interventon;  Surgeon: Bathe Denyse LABOR, MD;  Location: [facility];  Service: Cardiovascular;  Laterality: Right;   LEFT HEART CATH AND CORONARY ANGIOGRAPHY N/A 03/19/2022   Procedure: LEFT HEART CATH AND CORONARY ANGIOGRAPHY;  Surgeon: Bathe Denyse LABOR, MD;  Location: [facility];  Service: Cardiovascular;  Laterality: N/A;   LEFT HEART CATH AND CORONARY ANGIOGRAPHY Left 04/24/2023   Procedure: LEFT HEART CATH AND CORONARY ANGIOGRAPHY with coronary intervention;  Surgeon: Bathe Denyse LABOR, MD;  Location: [facility];  Service: Cardiovascular;  Laterality: Left;   TOTAL HIP ARTHROPLASTY     bilateral     Current Outpatient Medications  Medication Sig Dispense Refill   aspirin  EC 81 MG tablet Take 81 mg by mouth daily. Swallow whole.     atorvastatin  (LIPITOR ) 80 MG tablet Take 1 tablet (80 mg total) by mouth every evening. 90 tablet 0   budesonide -glycopyrrolate-formoterol  (BREZTRI  AEROSPHERE) 160-9-4.8 MCG/ACT AERO inhaler Inhale 2 puffs into  the lungs 2 (two) times daily. 10.7 g 11   Calcium  Carb-Cholecalciferol (CALCIUM  + D3 PO) Take 1 tablet by mouth daily.     calcium  carbonate (TUMS - DOSED IN MG ELEMENTAL CALCIUM ) 500 MG chewable tablet Chew 1,500-2,000 mg by mouth daily as needed for indigestion or heartburn.     CINNAMON PO Take 1,000 mg by mouth daily.     clopidogrel  (PLAVIX ) 75 MG tablet TAKE ONE TABLET BY MOUTH ONCE DAILY 90 tablet 0   ezetimibe (ZETIA) 10 MG tablet Take 10 mg by mouth daily.     hydrochlorothiazide  (HYDRODIURIL ) 25 MG tablet Take 25 mg by mouth daily.      isosorbide  mononitrate (IMDUR ) 60 MG 24 hr tablet TAKE ONE TABLET BY MOUTH TWICE A DAY. 180 tablet 0   metoprolol  succinate (TOPROL -XL) 50 MG 24 hr tablet TAKE ONE TABLET BY MOUTH ONCE DAILY. TAKE WITH OR IMMEDIATELY FOLLOWING A MEAL. 30 tablet 11   nitroGLYCERIN  (NITROSTAT ) 0.4 MG SL tablet Place 1 tablet (0.4 mg total) under the tongue every 5 (five) minutes as needed for chest pain. 100 tablet 3   Omega-3 Fatty Acids (FISH OIL) 1200 MG CAPS Take 1,200 mg by mouth daily.     pantoprazole  (PROTONIX ) 40 MG tablet Take 1 tablet (40 mg total) by mouth daily. 30 tablet 11   ranolazine  (RANEXA ) 1000 MG SR tablet TAKE ONE TABLET BY MOUTH TWICE DAILY 60 tablet 0   zinc gluconate 50 MG tablet Take 50 mg by mouth 2 (two) times daily.     GARLIC PO Take 1,000 mg by mouth daily.     No current facility-administered medications for this visit.    Allergies:   Benazepril-hydrochlorothiazide     Social History:   reports that he quit smoking about 28 years ago. His smoking use included cigarettes. He has never used smokeless tobacco. He reports current alcohol use of about 12.0 standard drinks of alcohol per week. He reports that he does not use drugs.   Family History:  family history includes Hypertension in his mother.    ROS:     Review of Systems  Constitutional: Negative.   HENT: Negative.    Eyes: Negative.   Respiratory: Negative.    Gastrointestinal: Negative.   Genitourinary: Negative.   Musculoskeletal: Negative.   Skin: Negative.   Neurological: Negative.   Endo/Heme/Allergies: Negative.   Psychiatric/Behavioral: Negative.    All other systems reviewed and are negative.     All other systems are reviewed and negative.    PHYSICAL EXAM: VS:  BP (!) 140/86   Pulse (!) 57   Ht 5' 11 (1.803 m)   Wt 181 lb (82.1 kg)   SpO2 99%   BMI 25.24 kg/m  , BMI Body mass index is 25.24 kg/m. Last weight:  Wt Readings from Last 3 Encounters:  05/06/24 181 lb (82.1 kg)  02/03/24 178  lb 6.4 oz (80.9 kg)  01/12/24 180 lb (81.6 kg)     Physical Exam Vitals reviewed.  Constitutional:      Appearance: Normal appearance. He is normal weight.  HENT:     Head: Normocephalic.     Nose: Nose normal.     Mouth/Throat:     Mouth: Mucous membranes are moist.  Eyes:     Pupils: Pupils are equal, round, and reactive to light.  Cardiovascular:     Rate and Rhythm: Normal rate and regular rhythm.     Pulses: Normal pulses.     Heart sounds:  Normal heart sounds.  Pulmonary:     Effort: Pulmonary effort is normal.  Abdominal:     General: Abdomen is flat. Bowel sounds are normal.  Musculoskeletal:        General: Normal range of motion.     Cervical back: Normal range of motion.  Skin:    General: Skin is warm.  Neurological:     General: No focal deficit present.     Mental Status: He is alert.  Psychiatric:        Mood and Affect: Mood normal.       EKG:   Recent Labs: No results found for requested labs within last 365 days.    Lipid Panel No results found for: CHOL, TRIG, HDL, CHOLHDL, VLDL, LDLCALC, LDLDIRECT    Other studies Reviewed: Additional studies/ records that were reviewed today include:  Review of the above records demonstrates:       No data to display            ASSESSMENT AND PLAN:    ICD-10-CM   1. SOB (shortness of breath)  R06.02     2. Other chest pain  R07.89    continue medical treatment, no ischaemia on stress test.    3. PAD (peripheral artery disease)  I73.9     4. 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 Bilateral carotid artery stenosis  I65.23     6. Coronary stent restenosis, initial encounter  T82.855A     7. Mixed hyperlipidemia  E78.2     8. Essential hypertension  I10     9. 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          Disposition:   Return in about 3 months (around 08/03/2024).    Total time spent: 35 minutes  Signed,  Denyse Bathe, MD  05/06/2024 10:32 AM    [facility] "

## 2024-05-31 ENCOUNTER — Ambulatory Visit (INDEPENDENT_AMBULATORY_CARE_PROVIDER_SITE_OTHER): Admitting: Vascular Surgery

## 2024-05-31 ENCOUNTER — Encounter (INDEPENDENT_AMBULATORY_CARE_PROVIDER_SITE_OTHER)

## 2024-07-12 ENCOUNTER — Ambulatory Visit: Admitting: Internal Medicine

## 2024-08-03 ENCOUNTER — Ambulatory Visit: Admitting: Cardiovascular Disease
